# Patient Record
Sex: Female | Born: 1950
Health system: Southern US, Community
[De-identification: ages and names within clinical notes are randomized; demographics above are authoritative.]

## PROBLEM LIST (undated history)

## (undated) DIAGNOSIS — E785 Hyperlipidemia, unspecified: Secondary | ICD-10-CM

## (undated) DIAGNOSIS — M519 Unspecified thoracic, thoracolumbar and lumbosacral intervertebral disc disorder: Secondary | ICD-10-CM

## (undated) HISTORY — DX: Hyperlipidemia, unspecified: E78.5

## (undated) HISTORY — PX: CYSTECTOMY: SUR359

## (undated) HISTORY — DX: Unspecified thoracic, thoracolumbar and lumbosacral intervertebral disc disorder: M51.9

## (undated) HISTORY — PX: OTHER SURGICAL HISTORY: SHX169

---

## 1997-09-20 ENCOUNTER — Emergency Department (HOSPITAL_COMMUNITY): Admission: EM | Admit: 1997-09-20 | Discharge: 1997-09-20 | Payer: Self-pay | Admitting: Emergency Medicine

## 1997-09-23 ENCOUNTER — Emergency Department (HOSPITAL_COMMUNITY): Admission: EM | Admit: 1997-09-23 | Discharge: 1997-09-23 | Payer: Self-pay | Admitting: Emergency Medicine

## 1997-09-27 ENCOUNTER — Encounter (HOSPITAL_COMMUNITY): Admission: RE | Admit: 1997-09-27 | Discharge: 1997-12-26 | Payer: Self-pay | Admitting: Emergency Medicine

## 1997-10-29 ENCOUNTER — Emergency Department (HOSPITAL_COMMUNITY): Admission: EM | Admit: 1997-10-29 | Discharge: 1997-10-29 | Payer: Self-pay | Admitting: Emergency Medicine

## 1998-04-21 DIAGNOSIS — M519 Unspecified thoracic, thoracolumbar and lumbosacral intervertebral disc disorder: Secondary | ICD-10-CM

## 1998-04-21 HISTORY — DX: Unspecified thoracic, thoracolumbar and lumbosacral intervertebral disc disorder: M51.9

## 1998-08-08 ENCOUNTER — Other Ambulatory Visit: Admission: RE | Admit: 1998-08-08 | Discharge: 1998-08-08 | Payer: Self-pay | Admitting: Obstetrics and Gynecology

## 1999-09-23 ENCOUNTER — Other Ambulatory Visit: Admission: RE | Admit: 1999-09-23 | Discharge: 1999-09-23 | Payer: Self-pay | Admitting: Obstetrics and Gynecology

## 2002-02-28 ENCOUNTER — Other Ambulatory Visit: Admission: RE | Admit: 2002-02-28 | Discharge: 2002-02-28 | Payer: Self-pay | Admitting: Internal Medicine

## 2002-04-21 HISTORY — PX: OTHER SURGICAL HISTORY: SHX169

## 2003-06-06 ENCOUNTER — Other Ambulatory Visit: Admission: RE | Admit: 2003-06-06 | Discharge: 2003-06-06 | Payer: Self-pay | Admitting: Internal Medicine

## 2004-07-16 ENCOUNTER — Ambulatory Visit: Payer: Self-pay | Admitting: Family Medicine

## 2004-07-29 ENCOUNTER — Ambulatory Visit: Payer: Self-pay | Admitting: Family Medicine

## 2004-07-30 ENCOUNTER — Other Ambulatory Visit: Admission: RE | Admit: 2004-07-30 | Discharge: 2004-07-30 | Payer: Self-pay | Admitting: Family Medicine

## 2004-07-30 ENCOUNTER — Ambulatory Visit: Payer: Self-pay | Admitting: Family Medicine

## 2004-08-06 ENCOUNTER — Ambulatory Visit: Payer: Self-pay | Admitting: Family Medicine

## 2004-09-13 ENCOUNTER — Ambulatory Visit: Payer: Self-pay | Admitting: Family Medicine

## 2005-10-16 ENCOUNTER — Ambulatory Visit: Payer: Self-pay | Admitting: Family Medicine

## 2005-10-16 ENCOUNTER — Other Ambulatory Visit: Admission: RE | Admit: 2005-10-16 | Discharge: 2005-10-16 | Payer: Self-pay | Admitting: Family Medicine

## 2005-10-20 ENCOUNTER — Ambulatory Visit: Payer: Self-pay | Admitting: Family Medicine

## 2005-10-20 LAB — CONVERTED CEMR LAB: Blood Glucose, Fasting: 106 mg/dL

## 2005-11-07 ENCOUNTER — Ambulatory Visit: Payer: Self-pay | Admitting: Family Medicine

## 2006-12-11 ENCOUNTER — Encounter (INDEPENDENT_AMBULATORY_CARE_PROVIDER_SITE_OTHER): Payer: Self-pay | Admitting: Internal Medicine

## 2006-12-16 ENCOUNTER — Encounter (INDEPENDENT_AMBULATORY_CARE_PROVIDER_SITE_OTHER): Payer: Self-pay | Admitting: Internal Medicine

## 2006-12-16 ENCOUNTER — Ambulatory Visit (HOSPITAL_BASED_OUTPATIENT_CLINIC_OR_DEPARTMENT_OTHER): Admission: RE | Admit: 2006-12-16 | Discharge: 2006-12-16 | Payer: Self-pay | Admitting: Orthopedic Surgery

## 2006-12-16 ENCOUNTER — Encounter (INDEPENDENT_AMBULATORY_CARE_PROVIDER_SITE_OTHER): Payer: Self-pay | Admitting: Orthopedic Surgery

## 2006-12-16 HISTORY — PX: OTHER SURGICAL HISTORY: SHX169

## 2006-12-25 ENCOUNTER — Encounter: Payer: Self-pay | Admitting: Family Medicine

## 2006-12-29 ENCOUNTER — Encounter: Payer: Self-pay | Admitting: Family Medicine

## 2006-12-30 ENCOUNTER — Encounter: Payer: Self-pay | Admitting: Family Medicine

## 2006-12-30 DIAGNOSIS — E7800 Pure hypercholesterolemia, unspecified: Secondary | ICD-10-CM | POA: Insufficient documentation

## 2006-12-30 DIAGNOSIS — N6009 Solitary cyst of unspecified breast: Secondary | ICD-10-CM | POA: Insufficient documentation

## 2006-12-30 DIAGNOSIS — R7309 Other abnormal glucose: Secondary | ICD-10-CM | POA: Insufficient documentation

## 2006-12-31 ENCOUNTER — Encounter (INDEPENDENT_AMBULATORY_CARE_PROVIDER_SITE_OTHER): Payer: Self-pay | Admitting: Internal Medicine

## 2007-01-01 ENCOUNTER — Encounter (INDEPENDENT_AMBULATORY_CARE_PROVIDER_SITE_OTHER): Payer: Self-pay | Admitting: *Deleted

## 2007-01-20 ENCOUNTER — Encounter: Payer: Self-pay | Admitting: Family Medicine

## 2007-01-21 ENCOUNTER — Ambulatory Visit: Payer: Self-pay | Admitting: Family Medicine

## 2007-01-24 LAB — CONVERTED CEMR LAB
BUN: 12 mg/dL (ref 6–23)
CO2: 30 meq/L (ref 19–32)
Cholesterol: 191 mg/dL (ref 0–200)
GFR calc Af Amer: 84 mL/min
Glucose, Bld: 87 mg/dL (ref 70–99)
Potassium: 4.5 meq/L (ref 3.5–5.1)
Sodium: 140 meq/L (ref 135–145)
Total CHOL/HDL Ratio: 3.7
Triglycerides: 67 mg/dL (ref 0–149)

## 2007-01-26 ENCOUNTER — Other Ambulatory Visit: Admission: RE | Admit: 2007-01-26 | Discharge: 2007-01-26 | Payer: Self-pay | Admitting: Family Medicine

## 2007-01-26 ENCOUNTER — Encounter: Payer: Self-pay | Admitting: Family Medicine

## 2007-01-26 ENCOUNTER — Ambulatory Visit: Payer: Self-pay | Admitting: Family Medicine

## 2007-01-26 LAB — CONVERTED CEMR LAB: Pap Smear: NORMAL

## 2007-01-27 ENCOUNTER — Encounter: Payer: Self-pay | Admitting: Family Medicine

## 2007-02-03 ENCOUNTER — Encounter (INDEPENDENT_AMBULATORY_CARE_PROVIDER_SITE_OTHER): Payer: Self-pay | Admitting: *Deleted

## 2007-03-09 ENCOUNTER — Ambulatory Visit: Payer: Self-pay | Admitting: Family Medicine

## 2007-03-10 ENCOUNTER — Encounter (INDEPENDENT_AMBULATORY_CARE_PROVIDER_SITE_OTHER): Payer: Self-pay | Admitting: *Deleted

## 2008-10-02 ENCOUNTER — Ambulatory Visit: Payer: Self-pay | Admitting: Family Medicine

## 2008-10-03 LAB — CONVERTED CEMR LAB
ALT: 18 units/L (ref 0–35)
AST: 19 units/L (ref 0–37)
Bilirubin, Direct: 0.1 mg/dL (ref 0.0–0.3)
Chloride: 101 meq/L (ref 96–112)
LDL Cholesterol: 116 mg/dL — ABNORMAL HIGH (ref 0–99)
Microalb Creat Ratio: 3.6 mg/g (ref 0.0–30.0)
Microalb, Ur: 0.4 mg/dL (ref 0.0–1.9)
Potassium: 4.7 meq/L (ref 3.5–5.1)
Total Bilirubin: 0.7 mg/dL (ref 0.3–1.2)
Total CHOL/HDL Ratio: 3

## 2008-10-09 ENCOUNTER — Other Ambulatory Visit: Admission: RE | Admit: 2008-10-09 | Discharge: 2008-10-09 | Payer: Self-pay | Admitting: Family Medicine

## 2008-10-09 ENCOUNTER — Ambulatory Visit: Payer: Self-pay | Admitting: Family Medicine

## 2008-10-09 ENCOUNTER — Encounter: Payer: Self-pay | Admitting: Family Medicine

## 2008-10-09 LAB — HM PAP SMEAR

## 2008-10-13 ENCOUNTER — Encounter (INDEPENDENT_AMBULATORY_CARE_PROVIDER_SITE_OTHER): Payer: Self-pay | Admitting: *Deleted

## 2008-10-25 ENCOUNTER — Encounter: Payer: Self-pay | Admitting: Family Medicine

## 2009-11-21 ENCOUNTER — Encounter (INDEPENDENT_AMBULATORY_CARE_PROVIDER_SITE_OTHER): Payer: Self-pay | Admitting: *Deleted

## 2009-12-29 ENCOUNTER — Emergency Department (HOSPITAL_BASED_OUTPATIENT_CLINIC_OR_DEPARTMENT_OTHER): Admission: EM | Admit: 2009-12-29 | Discharge: 2009-12-30 | Payer: Self-pay | Admitting: Emergency Medicine

## 2009-12-29 ENCOUNTER — Ambulatory Visit: Payer: Self-pay | Admitting: Diagnostic Radiology

## 2010-01-09 ENCOUNTER — Ambulatory Visit: Payer: Self-pay | Admitting: Family Medicine

## 2010-01-14 ENCOUNTER — Telehealth (INDEPENDENT_AMBULATORY_CARE_PROVIDER_SITE_OTHER): Payer: Self-pay | Admitting: *Deleted

## 2010-01-15 ENCOUNTER — Ambulatory Visit: Payer: Self-pay | Admitting: Family Medicine

## 2010-01-16 LAB — CONVERTED CEMR LAB
AST: 17 units/L (ref 0–37)
Albumin: 4.4 g/dL (ref 3.5–5.2)
Alkaline Phosphatase: 66 units/L (ref 39–117)
Bilirubin, Direct: 0.1 mg/dL (ref 0.0–0.3)
CO2: 31 meq/L (ref 19–32)
Direct LDL: 132.5 mg/dL
GFR calc non Af Amer: 78.62 mL/min (ref >60)
Glucose, Bld: 92 mg/dL (ref 70–99)
Potassium: 4.6 meq/L (ref 3.5–5.1)
Sodium: 142 meq/L (ref 135–145)
VLDL: 22.4 mg/dL (ref 0.0–40.0)

## 2010-01-17 ENCOUNTER — Ambulatory Visit: Payer: Self-pay | Admitting: Family Medicine

## 2010-01-31 ENCOUNTER — Ambulatory Visit: Payer: Self-pay | Admitting: Family Medicine

## 2010-02-01 ENCOUNTER — Encounter (INDEPENDENT_AMBULATORY_CARE_PROVIDER_SITE_OTHER): Payer: Self-pay | Admitting: *Deleted

## 2010-02-01 ENCOUNTER — Encounter: Payer: Self-pay | Admitting: Family Medicine

## 2010-02-01 LAB — CONVERTED CEMR LAB: Fecal Occult Bld: NEGATIVE

## 2010-05-21 NOTE — Letter (Signed)
Summary: Results Follow up Letter  [place] at [facility]  [address] [place], [place] [address]   Phone: [phone]  Fax: [phone]    02/01/2010 MRN: [id number]    Cheryl Melendez [address] [place], [place]  [address]    Dear [name],  The following are the results of your recent test(s):  Test         Result    Pap Smear:        Normal _____  Not Normal _____ Comments: ______________________________________________________ Cholesterol: LDL(Bad cholesterol):         Your goal is less than:         HDL (Good cholesterol):       Your goal is more than: Comments:  ______________________________________________________ Mammogram:        Normal _____  Not Normal _____ Comments:  ___________________________________________________________________ Hemoccult:        Normal _X____  Not normal _______ Comments:    _____________________________________________________________________ Other Tests:    We routinely do not discuss normal results over the telephone.  If you desire a copy of the results, or you have any questions about this information we can discuss them at your next office visit.   Sincerely,    Roselyn Bering  GD/ri

## 2010-05-21 NOTE — Letter (Signed)
Summary: [facility]: Immunoassay Fecal Occult Blood (iFOB) Order Form  [place] at [facility]  [address] [place], [place] [address]   Phone: [phone]  Fax: [phone]      [facility]: Immunoassay Fecal Occult Blood (iFOB) Order Form   January 17, 2010 MRN: [id number]   Cheryl Melendez 02-02-1951   Physicican Name:______duncan___________________  Diagnosis Code:_______v76.49___________________      Crawford Givens MD

## 2010-05-21 NOTE — Assessment & Plan Note (Signed)
Summary: CPX/JRR   Vital Signs:  Patient profile:   59 year old female Height:      66.25 inches Weight:      165 pounds BMI:     26.53 Temp:     98.6 degrees F oral Pulse rate:   96 / minute Pulse rhythm:   regular BP sitting:   108 / 70  (left arm) Cuff size:   large  Vitals Entered By: Delilah Shan CMA Duncan Dull) (January 17, 2010 2:31 PM) CC: CPX -  ? Pap (menopausal)   History of Present Illness: Here for CPE.  No c/o.  L 5th finger healing well. See plan.   Allergies: 1)  ! * Bee Stings 2)  Sulfa  Past History:  Family History: Last updated: 01/17/2010 Father: A DM, CABGx 4 Mother: dec 85  HTN, alzheimer's Brother A  HTN glaucoma  Smoke ETOH Brother  A   obesity    knee arthritis Smoke  Social History: Last updated: 01/17/2010 Marital Status: Married 1975 lives with husband. 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 Father: A DM, CABGx 4 Mother: dec 85  HTN, alzheimer's Brother A  HTN glaucoma  Smoke ETOH Brother  A   obesity    knee arthritis Smoke  Social History: Reviewed history from 10/09/2008 and no changes required. Marital Status: Married 1975 lives with husband. 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  Otherwise negative.   labs reviewed with patient.   Physical Exam  General:  GEN: nad, alert and oriented HEENT: mucous membranes moist NECK: supple w/o LA CV: rrr.  no murmur PULM: ctab, no inc wob ABD: soft, +bs EXT: no  edema SKIN: no acute rash, lac on finger healing w/o erythema.  finger ROM intact    Impression & Recommendations:  Problem # 1:  Preventive Health Care (ICD-V70.0) IFOB sent home iwht patient.  Flu shot at work.  Mammogram deferred to next year.  q2 year screening for low risk patient is acceptable and patient prefers.  no h/o breast CA/mass/discharge/new finding per patient.  H/o many (>20 per pt) negative paps with no vaginal/GU symptoms.  Will defer pap until 2013. d/w patient re: options re: colon CA screening, including risk and benefits of IFOB vs. colonoscopy.  Pt is aware of all options and opts for IFOB.  d/w patient RW:ERXV, increase exercise and recheck in 1 year.  healthy diet d/w patient.   Complete Medication List: 1)  Cod Liver Oil  .... Take one by mouth once a day 2)  Fish Oil 1000 Mg Caps (Omega-3 fatty acids) .... Take one by mouth once a day 3)  Multivitamins Tabs (Multiple vitamin) .... One daily 4)  Epipen 0.3 Mg/0.63ml (1:1000) Devi (Epinephrine hcl (anaphylaxis)) .... As dir  Patient Instructions: 1)  Get your flu shot at work and drop off the stool cards.  We'll let you know about the results. 2)  Start back with yoga after your hand finishes healing.  3)  I would  get a mammogram in 2012 and a pap smear in 2013.  4)  Take care.  Glad to see you today.  Prescriptions: EPIPEN 0.3 MG/0.3ML (1:1000) DEVI (EPINEPHRINE HCL (ANAPHYLAXIS)) as dir  #1 x 1   Entered and Authorized by:   Crawford Givens MD   Signed by:   Crawford Givens MD on 01/17/2010   Method used:   [facility]        [facility]  [address] (retail)       [address]       [place], [place]  [address]       Ph: [phone] or [phone]       Fax: [phone]   RxID:   [id number]   Current Allergies (reviewed today): ! * BEE STINGS SULFA

## 2010-05-21 NOTE — Letter (Signed)
Summary: Results Follow up Letter  [place] at [facility]  [address] [place], [place] [address]   Phone: [phone]  Fax: [phone]    02/01/2010 MRN: [id number]  [name] [address] [place], [place]  [address]  Dear Ms. Cheryl Melendez,  The following are the results of your recent test(s):  Test         Result    Pap Smear:        Normal _____  Not Normal _____ Comments: ______________________________________________________ Cholesterol: LDL(Bad cholesterol):         Your goal is less than:         HDL (Good cholesterol):       Your goal is more than: Comments:  ______________________________________________________ Mammogram:        Normal _____  Not Normal _____ Comments:  ___________________________________________________________________ Hemoccult:        Normal __X___  Not normal _______ Comments:  Repeat in 1 year  _____________________________________________________________________ Other Tests:    We routinely do not discuss normal results over the telephone.  If you desire a copy of the results, or you have any questions about this information we can discuss them at your next office visit.   Sincerely,     Janee Morn, CMA for  Dr. Raechel Ache

## 2010-05-21 NOTE — Letter (Signed)
Summary: Nadara Eaton letter  [place] at [facility]  [address] [place], [place] [address]   Phone: [phone]  Fax: [phone]       11/21/2009 MRN: [id number]  JAILEY BOOTON [address] [place], [place]  [address]  Dear Ms. [facility] [place] [place] announce the retirement of Arta Silence, M.D., from full-time practice at the [facility] office effective October 18, 2009 and his plans of returning part-time.  It is important to Dr. Hetty Ely and to our practice that you understand that [facility] has seven physicians in our office for your health care needs.  We will continue to offer the same exceptional care that you have today.    Dr. Hetty Ely has spoken to many of you about his plans for retirement and returning part-time in the fall.   We will continue to work with you through the transition to schedule appointments for you in the office and meet the high standards that [place] is committed to.   Again, it is with great pleasure that we share the news that Dr. Hetty Ely will return to [facility] in October of 2011 with a reduced schedule.    If you have any questions, or would like to request an appointment with one of our physicians, please call us at [phone] and press the option for Scheduling an appointment.  We take pleasure in providing you with excellent patient care and look forward to seeing you at your next office visit.  Our [facility] Physicians are:  Tillman Abide, M.D. Laurita Quint, M.D. Roxy Manns, M.D. Kerby Nora, M.D. Hannah Beat, M.D. Ruthe Mannan, M.D. We proudly welcomed Raechel Ache, M.D. and Eustaquio Boyden, M.D. to the practice in July/August 2011.  Sincerely,  [place] [facility]

## 2010-05-21 NOTE — Progress Notes (Signed)
----   Converted from flag ---- ---- 01/14/2010 8:27 AM, Crawford Givens MD wrote: cmet/lipid 272.0  ---- 01/14/2010 7:52 AM, Liane Comber CMA (AAMA) wrote: Lab orders please! Good Morning! This pt is scheduled for cpx labs tomorrow, which labs to draw and dx codes to use? Thanks Tasha ------------------------------

## 2010-05-21 NOTE — Assessment & Plan Note (Signed)
Summary: F/U [place] 12/29/09/[facility]   Vital Signs:  Patient profile:   60 year old female Height:      66.25 inches Weight:      163.50 pounds BMI:     26.29 Temp:     98.3 degrees F oral Pulse rate:   72 / minute Pulse rhythm:   regular BP sitting:   120 / 72  (left arm)  Vitals Entered By: Melody Comas (January 09, 2010 3:35 PM)  History of Present Illness: Seen at ER 12/30/09.  9 suture to L 5th digit.  here for removal.  feeling well.  Some mild decrease in sensation on 5th digit.  Tdap updated at ER.   Allergies: 1)  ! * Bee Stings 2)  Sulfa  Review of Systems       See HPI.  Otherwise negative.    Physical Exam  General:  L 5th finger with 9 intact sutures and good tissue approximation.  No erythema.  9 sutures removed successfully.  Still with good tissue approximation.  There was some graulation tissue that was removed to expose the 9th suture.  Area covered with bandaid and bacitracin.  Tolerated well. No decrease in ext but with some mild decrease in ROM for flex at PIP and DIP.  Sensation intact on finger distally but slightly decreased from 4th digit.  Normal cap refill.    Impression & Recommendations:  Problem # 1:  ENCOUNTER FOR REMOVAL OF SUTURES (ICD-V58.32) Removed.  d/w patient re:ROM and wound care.  Gradual return to activity.  Fu as needed. She understood.  Orders: Suture Removal by Non-Operative MD [phone])  Complete Medication List: 1)  Cod Liver Oil  .... Take one by mouth once a day 2)  Fish Oil 1000 Mg Caps (Omega-3 fatty acids) .... Take one by mouth once a day 3)  Multivitamins Tabs (Multiple vitamin) .... One daily 4)  Epipen 0.3 Mg/0.11ml (1:1000) Devi (Epinephrine hcl (anaphylaxis)) .... As dir  Patient Instructions: 1)  Work on your range of motion with a tennis ball.  Use a bandaid and bacitracin in the meantime.  Let us know if you have redness or drainage from the lesion.  Take care.  Glad to see you.   Current Allergies  (reviewed today): ! * BEE STINGS SULFA

## 2010-07-31 ENCOUNTER — Encounter: Payer: Self-pay | Admitting: Family Medicine

## 2010-08-01 ENCOUNTER — Encounter: Payer: Self-pay | Admitting: Family Medicine

## 2010-08-01 ENCOUNTER — Ambulatory Visit (INDEPENDENT_AMBULATORY_CARE_PROVIDER_SITE_OTHER): Payer: BC Managed Care – PPO | Admitting: Family Medicine

## 2010-08-01 VITALS — BP 108/64 | HR 76 | Temp 98.3°F | Ht 66.0 in | Wt 164.8 lb

## 2010-08-01 DIAGNOSIS — J069 Acute upper respiratory infection, unspecified: Secondary | ICD-10-CM

## 2010-08-01 DIAGNOSIS — J029 Acute pharyngitis, unspecified: Secondary | ICD-10-CM

## 2010-08-01 NOTE — Progress Notes (Signed)
  Subjective:    Patient ID: Cheryl Melendez, female    DOB: [birth year], 60 y.o.   MRN: [id number]  HPI Pt here for ST for three days, some chills yesterday, no fever, was tired yesterday. She has had headache around the eyes, mucous, has been taking sudafed, tyl and benadryl at night. No ear pain, no fever. She is achy.     Review of Systems Noncontrib except as above.     Objective:   Physical Exam  Constitutional: She appears well-developed and well-nourished. No distress.  HENT:  Head: Normocephalic and atraumatic.  Right Ear: External ear normal.  Left Ear: External ear normal.  Nose: Nose normal.  Mouth/Throat: Oropharynx is clear and moist. No oropharyngeal exudate.  Eyes: Conjunctivae and EOM are normal. Pupils are equal, round, and reactive to light.  Neck: Normal range of motion. Neck supple. No thyromegaly present.  Cardiovascular: Normal rate, regular rhythm and normal heart sounds.   Pulmonary/Chest: Effort normal and breath sounds normal. She has no wheezes. She has no rales.  Lymphadenopathy:    She has no cervical adenopathy.  Skin: She is not diaphoretic.          Assessment & Plan:  Presumed viral URI Rapid strep done. Neg. See instructions.

## 2010-08-01 NOTE — Patient Instructions (Signed)
Take Guaifenesin (400mg ), take 11/2 tabs by mouth AM and NOON. Get GUAIFENESIN by  [facility] or [facility] and getting MUCOUS RELIEF EXPECTORANT/CONGESTION. DO NOT GET MUCINEX (Timed Release Guaifenesin) Gargle with 30ccs of warm salt water every half hour for 2 days as able. Tylenol 1-2 tabs po q4h prn Keep lozenge in mouth all day long while awake to decrease reactive cough.

## 2010-09-03 NOTE — Op Note (Signed)
NAME:  Cheryl Melendez, Cheryl Melendez NO.:  [id number]   MEDICAL RECORD NO.:  [id number]          PATIENT TYPE:  AMB   LOCATION:  DSC                          FACILITY:  [facility]   PHYSICIAN:  Cindee Salt, M.D.       DATE OF BIRTH:  [birth year]   DATE OF PROCEDURE:  12/16/2006  DATE OF DISCHARGE:                               OPERATIVE REPORT   PREOPERATIVE DIAGNOSIS:  Mucoid cyst, left thumb.   POSTOPERATIVE DIAGNOSIS:  Mucoid cyst, left thumb.   OPERATION:  Excision of cyst, excision of loose body, debridement of  interphalangeal joint, left thumb.   SURGEON:  Cindee Salt, M.D.   ASSISTANT:  Alfredo Bach, R.N.   ANESTHESIA:  Forearm-based IV regional.   HISTORY:  The patient is a 60 year old female with a history of a mass  over the dorsal aspect of the PIP joint of her thumb and degenerative  changes on x-ray.  This does transilluminate.  She is desirous of having  this excised with debridement of the joint.  She is aware of risks and  complications including infection, recurrence, injury to arteries,  nerves or tendons, incomplete relief of symptoms, dystrophy, stiffness  of the joint.  In the preoperative area, the patient is seen, questions  encouraged and answered, the extremity marked by both the patient and  surgeon.   PROCEDURE:  The patient was brought to the operating room, where a  forearm-based IV regional anesthetic was carried out without difficulty.  She was prepped using DuraPrep, supine position, left arm free.  A  curvilinear incision was made over the interphalangeal joint and carried  down through subcutaneous tissue.  The cyst was immediately encountered  with typical cystic fluid.  With blunt and sharp dissection, this was  dissected free.  A large loose body was present; this was removed and  the joint opened, a debridement and synovectomy performed to the joint.  Moderate arthritic changes were present.  The wound was irrigated.  Specimen was sent to  Pathology.  The skin was then closed with  interrupted Vicryl Rapide sutures of 5-0 in nature.  A sterile  compressive dressing and splint to the thumb were applied.  The patient  tolerated the procedure well and was taken to the recovery room for  observation in satisfactory condition.   She is discharged home to return to the [facility] in 1  week on Vicodin.           ______________________________  Cindee Salt, M.D.     GK/MEDQ  D:  12/16/2006  T:  12/17/2006  Job:  [id number]   cc:   Arta Silence, MD

## 2011-01-31 LAB — POCT HEMOGLOBIN-HEMACUE
Hemoglobin: 15.3 — ABNORMAL HIGH
Operator id: 208731

## 2011-07-29 ENCOUNTER — Telehealth: Payer: Self-pay | Admitting: Family Medicine

## 2011-07-29 NOTE — Telephone Encounter (Signed)
Noted. Thanks.

## 2011-07-29 NOTE — Telephone Encounter (Signed)
Caller: Cheryl Melendez/Patient; PCP: Laurita Quint (retired); CB#: [phone]; ; ; Call regarding Cut On Shin From Garden Implement, May Need Stitches; 2"x 1/2" avulsion type laceration to her shin from a gardening tool at 1615 today. Pt has washed and dressed wound. Bleeding controlled. Tetanus UTD. Advised see in 4 hrs per lac protocol. RN spoke to [place] in the ofc who advises UC due to the lateness of the day. Pt is advised.

## 2011-07-30 ENCOUNTER — Telehealth: Payer: Self-pay | Admitting: Family Medicine

## 2011-07-30 NOTE — Telephone Encounter (Signed)
Noted.  Duplicate from yesterday's note.

## 2011-07-30 NOTE — Telephone Encounter (Signed)
Triage Record Num: [id number] Operator: Freddie Breech Patient Name: Cheryl Melendez Call Date & Time: 07/29/2011 4:46:38PM Patient Phone: [phone] PCP: Eustaquio Boyden Patient Gender: Female PCP Fax : [phone] Patient DOB: 1950/09/15 Practice Name: [facility] Reason for Call: Caller: [name]/Patient; PCP: Laurita Quint (retired); CB#: [phone]; ; ; Call regarding Cut On Shin From Garden Implement, May Need Stitches; 2"x 1/2" avulsion type laceration to her shin from a gardening tool at 1615 today. Pt has washed and dressed wound, drsg appled. Bleeding controlled. Tetanus UTD. Advised see in 4 hrs per lac protocol. RN spoke to [place] in the ofc who advises UC due to the lateness of the day. Pt is advised. Protocol(s) Used: Abrasions, Lacerations, Puncture Wounds Recommended Outcome per Protocol: See Provider within 4 hours Reason for Outcome: Any full thickness puncture wound (passes through the skin layers of epidermis and dermis and penetrates into the underlying subcutaneous tissue) Care Advice: ~ Another adult should drive. ~ Support injured part in position of comfort to reduce pain and prevent further damage; avoid unnecessary movement. Control Bleeding: - Cover wound with a clean cloth and apply firm pressure to control bleeding. - If bleeding continues through dressing, add more material. - DO NOT remove old dressing. - Continue to apply direct pressure. ~ 07/29/2011 4:51:33PM Page 1 of 1 [id number]

## 2011-09-08 ENCOUNTER — Encounter: Payer: Self-pay | Admitting: Family Medicine

## 2011-09-08 ENCOUNTER — Ambulatory Visit (INDEPENDENT_AMBULATORY_CARE_PROVIDER_SITE_OTHER): Payer: BC Managed Care – PPO | Admitting: Family Medicine

## 2011-09-08 VITALS — BP 112/70 | HR 60 | Temp 98.0°F | Wt 169.0 lb

## 2011-09-08 DIAGNOSIS — L03114 Cellulitis of left upper limb: Secondary | ICD-10-CM | POA: Insufficient documentation

## 2011-09-08 DIAGNOSIS — IMO0002 Reserved for concepts with insufficient information to code with codable children: Secondary | ICD-10-CM

## 2011-09-08 MED ORDER — CEPHALEXIN 500 MG PO CAPS: 500.0000 mg | ORAL_CAPSULE | Freq: Four times a day (QID) | ORAL | Status: AC

## 2011-09-08 NOTE — Patient Instructions (Addendum)
Call to schedule mammogram.  [phone] Return at your convenience to schedule physical. We will treat you as cellulitis for next 9 days.  Four times a day. If spreading, or fevers >101, nausea/vomiting, HA, neck stiffness, or confusion or joint pains please let us know.

## 2011-09-08 NOTE — Progress Notes (Signed)
  Subjective:    Patient ID: Cheryl Melendez, female    DOB: 03-22-1951, 61 y.o.   MRN: [id number]  HPI CC: swelling arm  2d h/o swelling under left arm just distal to axilla. Woke up with this.  Never itchy.  + painful, swollen, red, warm.  Had leftover keflex so started taking qid, has taken 4-5 pills.    Cat at home.  No cat bites or scratches.    Doesn't think anything bit her really.    No fevers/chills, abd pain, n/v, other rashes.  No swollen glands that she's noticed.  No new medicines, lotions, detergents, soaps or shampoos.  Never had similar rash.  R dorsal hand with bruise.  States tends to bruise easily.  Mammogram - last done 2010.  Review of Systems Per HPI    Objective:   Physical Exam  Nursing note and vitals reviewed. Constitutional: She appears well-developed and well-nourished. No distress.  HENT:  Mouth/Throat: Oropharynx is clear and moist. No oropharyngeal exudate.  Neck: Normal range of motion. Neck supple.  Lymphadenopathy:    She has no cervical adenopathy.    She has no axillary adenopathy.       Right axillary: No lateral adenopathy present.       Left axillary: No lateral adenopathy present.      Right: No supraclavicular adenopathy present.       Left: No supraclavicular adenopathy present.  Skin:          Left underarm distal to axilla with warm erythema present, not pruritic, somewhat tender.  Measured 4.5inches longitudinally       Assessment & Plan:

## 2011-09-08 NOTE — Assessment & Plan Note (Signed)
Recent chigger bites, ?if inciting event. Treat with finishing keflex course x 10 days. Update [place] if not improved after this. Doubt tick-borne as rash warm, tender, and no recent tick bites found.

## 2011-12-03 ENCOUNTER — Other Ambulatory Visit: Payer: Self-pay | Admitting: Family Medicine

## 2011-12-03 ENCOUNTER — Encounter: Payer: Self-pay | Admitting: Family Medicine

## 2011-12-03 ENCOUNTER — Other Ambulatory Visit (INDEPENDENT_AMBULATORY_CARE_PROVIDER_SITE_OTHER): Payer: BC Managed Care – PPO

## 2011-12-03 DIAGNOSIS — E7800 Pure hypercholesterolemia, unspecified: Secondary | ICD-10-CM

## 2011-12-03 LAB — LDL CHOLESTEROL, DIRECT: Direct LDL: 126.1 mg/dL

## 2011-12-03 LAB — LIPID PANEL
Cholesterol: 201 mg/dL — ABNORMAL HIGH (ref 0–200)
HDL: 64.5 mg/dL (ref >39.00)
Triglycerides: 70 mg/dL (ref 0.0–149.0)
VLDL: 14 mg/dL (ref 0.0–40.0)

## 2011-12-03 LAB — COMPREHENSIVE METABOLIC PANEL
AST: 17 U/L (ref 0–37)
Alkaline Phosphatase: 62 U/L (ref 39–117)
BUN: 18 mg/dL (ref 6–23)
Creatinine, Ser: 0.8 mg/dL (ref 0.4–1.2)
Glucose, Bld: 89 mg/dL (ref 70–99)
Potassium: 4.4 mEq/L (ref 3.5–5.1)
Total Bilirubin: 0.3 mg/dL (ref 0.3–1.2)

## 2011-12-04 ENCOUNTER — Encounter: Payer: Self-pay | Admitting: Family Medicine

## 2011-12-05 ENCOUNTER — Encounter: Payer: Self-pay | Admitting: *Deleted

## 2011-12-10 ENCOUNTER — Other Ambulatory Visit (HOSPITAL_COMMUNITY)
Admission: RE | Admit: 2011-12-10 | Discharge: 2011-12-10 | Disposition: A | Discharge status: Home / Self Care | Payer: BC Managed Care – PPO | Source: Ambulatory Visit | Attending: Family Medicine | Admitting: Family Medicine

## 2011-12-10 ENCOUNTER — Ambulatory Visit (INDEPENDENT_AMBULATORY_CARE_PROVIDER_SITE_OTHER): Payer: BC Managed Care – PPO | Admitting: Family Medicine

## 2011-12-10 ENCOUNTER — Encounter: Payer: Self-pay | Admitting: Family Medicine

## 2011-12-10 VITALS — BP 120/80 | HR 80 | Temp 98.1°F | Ht 65.5 in | Wt 169.0 lb

## 2011-12-10 DIAGNOSIS — Z0000 Encounter for general adult medical examination without abnormal findings: Secondary | ICD-10-CM

## 2011-12-10 DIAGNOSIS — B070 Plantar wart: Secondary | ICD-10-CM

## 2011-12-10 DIAGNOSIS — E7800 Pure hypercholesterolemia, unspecified: Secondary | ICD-10-CM

## 2011-12-10 DIAGNOSIS — Z1151 Encounter for screening for human papillomavirus (HPV): Secondary | ICD-10-CM | POA: Insufficient documentation

## 2011-12-10 DIAGNOSIS — Z01419 Encounter for gynecological examination (general) (routine) without abnormal findings: Secondary | ICD-10-CM | POA: Insufficient documentation

## 2011-12-10 MED ORDER — EPINEPHRINE 0.3 MG/0.3ML IJ DEVI: 0.3000 mg | Freq: Once | INTRAMUSCULAR | Status: DC

## 2011-12-10 NOTE — Patient Instructions (Addendum)
Could space out aspirin if desired. Good to see you today, call us with questions. Return in 1 year or as needed. For plantar wart: Treat with salicylic plaster (duofilm) and pumice stone. File dry skin off with pumice stone then soak foot in epsom salt bath at bedtime, then apply duofilm (or salicylic plaster and duct tape), keep dry and on for ~48 hours.  remove and repeat. Balance pain with treatment. Let us know how you are doing in 2-3 wks.  If no better, we may send you to foot doctor.

## 2011-12-10 NOTE — Assessment & Plan Note (Addendum)
Preventative protocols reviewed and updated unless pt declined. Discussed healthy diet/lifestyle. Mammogram normal 11/2011. iFOB today. Pelvic/pap today framingham risk 1%, may d/c or space out aspirin.

## 2011-12-10 NOTE — Assessment & Plan Note (Signed)
Chronic, stable, diet controlled. Lab Results  Component Value Date   CHOL 201* 12/03/2011   HDL 64.50 12/03/2011   LDLCALC 116* 10/02/2008   LDLDIRECT 126.1 12/03/2011   TRIG 70.0 12/03/2011   CHOLHDL 3 12/03/2011  reviewed #s with pt.

## 2011-12-10 NOTE — Assessment & Plan Note (Signed)
Reviewed treatment of plantar warts.  See pt instructions. If not improving as expected, refer to podiatry.

## 2011-12-10 NOTE — Progress Notes (Signed)
Subjective:    Patient ID: Cheryl Melendez, female    DOB: 1950/05/14, 61 y.o.   MRN: [id number]  HPI CC: CPE  Plantar's wart on right sole - using tape, has tried duct tape, has used clorox.  Present longstanding, years.  Initially used salicylic acid but always has come back.  Preventative: Last CPE 12/2009 Pap smear - last was 3 years ago, always normal. Mammogram - Birads1 11/2011. Colon screening - would like stool kits.  Has never had colonoscopy. Td 2007 Seat belt use 100% sunscreen use discussed  Caffeine: 2-3 cups coffee/day Married 1975 and lives with husband Occupation: Comptroller at [employer] Activity: walks, hikes, gardening, wants to restart yoga Diet: good water, fruits/vegetables daily, red meat 3-4 x/wk, fish 1x/wk  Medications and allergies reviewed and updated in chart.  Past histories reviewed and updated if relevant as below. Patient Active Problem List  Diagnosis  . HYPERCHOLESTEROLEMIA  . BREAST CYST, BENIGN  . HYPERGLYCEMIA  . ABNORMAL BLOOD CHEMISTRY NEC  . Cellulitis of arm, left   Past Medical History  Diagnosis Date  . HLD (hyperlipidemia)    Past Surgical History  Procedure Date  . Cystectomy 1990's    L branchial cleft  . Cesarean section     x 1  . Vaginal birth after cesarean section     x 1  . Excision of mucoid cyst 12/16/2006    left thumb  . Mri back 2004    L4/5 ruptured disc with radiculopathy   History  Substance Use Topics  . Smoking status: Never Smoker   . Smokeless tobacco: Never Used  . Alcohol Use: Yes     occ glass of wine   Family History  Problem Relation Age of Onset  . Hypertension Mother   . Alzheimer's disease Mother   . Diabetes Father   . Coronary artery disease Father 40    CABG x 4  . Hypertension Brother   . Alcohol abuse Brother   . Glaucoma Brother   . Arthritis Brother   . Obesity Brother     knee  . Cancer Neg Hx    Allergies  Allergen Reactions  . Bee Venom Swelling  . Sulfonamide  Derivatives     REACTION: rash   Current Outpatient Prescriptions on File Prior to Visit  Medication Sig Dispense Refill  . aspirin 81 MG tablet Take 81 mg by mouth daily.        . COD LIVER OIL PO Take by mouth daily.        . Multiple Vitamin (MULTIVITAMIN) tablet Take 1 tablet by mouth daily.        . Omega-3 Fatty Acids (FISH OIL) 1000 MG CAPS Take by mouth daily.           Review of Systems  Constitutional: Negative for fever, chills, activity change, appetite change, fatigue and unexpected weight change.  HENT: Negative for hearing loss and neck pain.   Eyes: Negative for visual disturbance.  Respiratory: Negative for cough, chest tightness, shortness of breath and wheezing.   Cardiovascular: Negative for chest pain, palpitations and leg swelling.  Gastrointestinal: Negative for nausea, vomiting, abdominal pain, diarrhea, constipation, blood in stool and abdominal distention.  Genitourinary: Negative for hematuria and difficulty urinating.  Musculoskeletal: Negative for myalgias and arthralgias.  Skin: Negative for rash.  Neurological: Negative for dizziness, seizures, syncope and headaches.  Hematological: Bruises/bleeds easily (takes asprin).  Psychiatric/Behavioral: Negative for dysphoric mood. The patient is not nervous/anxious.  Objective:   Physical Exam  Nursing note and vitals reviewed. Constitutional: She is oriented to person, place, and time. She appears well-developed and well-nourished. No distress.  HENT:  Head: Normocephalic and atraumatic.  Right Ear: External ear normal.  Left Ear: External ear normal.  Nose: Nose normal.  Mouth/Throat: Oropharynx is clear and moist. No oropharyngeal exudate.  Eyes: Conjunctivae and EOM are normal. Pupils are equal, round, and reactive to light. No scleral icterus.  Neck: Normal range of motion. Neck supple.  Cardiovascular: Normal rate, regular rhythm, normal heart sounds and intact distal pulses.   No murmur  heard. Pulses:      Radial pulses are 2+ on the right side, and 2+ on the left side.  Pulmonary/Chest: Effort normal and breath sounds normal. No respiratory distress. She has no wheezes. She has no rales. Right breast exhibits no inverted nipple, no mass, no nipple discharge, no skin change and no tenderness. Left breast exhibits no inverted nipple, no mass, no nipple discharge, no skin change and no tenderness. Breasts are symmetrical.  Abdominal: Soft. Bowel sounds are normal. She exhibits no distension and no mass. There is no tenderness. There is no rebound and no guarding.  Genitourinary: Vagina normal and uterus normal. Pelvic exam was performed with patient supine. There is no rash, tenderness, lesion or injury on the right labia. There is no rash, tenderness, lesion or injury on the left labia. Cervix exhibits no motion tenderness, no discharge and no friability. Right adnexum displays no mass, no tenderness and no fullness. Left adnexum displays no mass, no tenderness and no fullness. No tenderness around the vagina. No signs of injury around the vagina. No vaginal discharge found.  Musculoskeletal: Normal range of motion. She exhibits no edema.       Ball of right foot with noninfected lesion composed of macerated skin extending past epidermis and with 2-3 cores present.  Soft skin rpesent  Lymphadenopathy:    She has no cervical adenopathy.    She has no axillary adenopathy.       Right axillary: No lateral adenopathy present.       Left axillary: No lateral adenopathy present.      Right: No supraclavicular adenopathy present.       Left: No supraclavicular adenopathy present.  Neurological: She is alert and oriented to person, place, and time.       CN grossly intact, station and gait intact  Skin: Skin is warm and dry. No rash noted.  Psychiatric: She has a normal mood and affect. Her behavior is normal. Judgment and thought content normal.       Assessment & Plan:

## 2011-12-18 ENCOUNTER — Encounter: Payer: Self-pay | Admitting: *Deleted

## 2014-05-22 HISTORY — PX: HAND SURGERY: SHX662

## 2014-06-13 ENCOUNTER — Encounter: Payer: Self-pay | Admitting: Family Medicine

## 2016-09-29 ENCOUNTER — Encounter: Payer: Self-pay | Admitting: Internal Medicine

## 2016-09-29 ENCOUNTER — Ambulatory Visit (INDEPENDENT_AMBULATORY_CARE_PROVIDER_SITE_OTHER): Payer: BC Managed Care – PPO | Admitting: Internal Medicine

## 2016-09-29 VITALS — BP 126/74 | HR 71 | Temp 98.1°F | Ht 65.0 in | Wt 171.5 lb

## 2016-09-29 DIAGNOSIS — E7800 Pure hypercholesterolemia, unspecified: Secondary | ICD-10-CM

## 2016-09-29 DIAGNOSIS — R296 Repeated falls: Secondary | ICD-10-CM | POA: Diagnosis not present

## 2016-09-29 NOTE — Progress Notes (Signed)
HPI  Pt presents to the clinic today to reestablish care. She is transferring care from Dr. Morton StallGuttierez.  HLD: She reports she does not have high cholesterol. She does take Fish Oil daily. She tries to consume a low fat diet.  She is concerned about frequent falls. She has fallen twice in the last 3 years. She has had injuries with both falls. She is not sure what is causing her to fall, she reports she just falls straight down. She denies syncope, chest pain, shortness of breath. She does not stumble. She reports she has had a ruptured disc in 2008, but never had surgery. She feels like this caused some residual weakness on her right side but she is not positive. She denies family history of neuromuscular disorders.  Past Medical History:  Diagnosis Date  . HLD (hyperlipidemia)     Current Outpatient Prescriptions  Medication Sig Dispense Refill  . aspirin 81 MG tablet Take 81 mg by mouth daily. May space out    . COD LIVER OIL PO Take by mouth daily.      Marland Kitchen. EPINEPHrine (EPIPEN) 0.3 mg/0.3 mL DEVI Inject 0.3 mLs (0.3 mg total) into the muscle once. As directed 1 Device 1  . Multiple Vitamin (MULTIVITAMIN) tablet Take 1 tablet by mouth daily.      . Omega-3 Fatty Acids (FISH OIL) 1000 MG CAPS Take by mouth daily.       No current facility-administered medications for this visit.     Allergies  Allergen Reactions  . Bee Venom Swelling  . Sulfonamide Derivatives     REACTION: rash    Family History  Problem Relation Age of Onset  . Hypertension Mother   . Alzheimer's disease Mother   . Diabetes Father   . Coronary artery disease Father [id number]       CABG x 4  . Hypertension Brother   . Alcohol abuse Brother   . Glaucoma Brother   . Arthritis Brother   . Obesity Brother        knee  . Cancer Neg Hx     Social History   Social History  . Marital status: Married    Spouse name: N/A  . Number of children: 2  . Years of education: N/A   Occupational History  . Nursing  Teacher     [employer]   Social History Main Topics  . Smoking status: Never Smoker  . Smokeless tobacco: Never Used  . Alcohol use Yes     Comment: occ glass of wine  . Drug use: No  . Sexual activity: Not on file   Other Topics Concern  . Not on file   Social History Narrative   Caffeine: 2-3 cups coffee/day   Married 1975 and lives with husband   Occupation: Comptrollernursing teacher at [employer]   Activity: walks, hikes, gardening, wants to restart yoga   Diet: good water, fruits/vegetables daily, red meat 3-4 x/wk, fish 1x/wk    ROS:  Constitutional: Denies fever, malaise, fatigue, headache or abrupt weight changes.  HEENT: Denies eye pain, eye redness, ear pain, ringing in the ears, wax buildup, runny nose, nasal congestion, bloody nose, or sore throat. Respiratory: Denies difficulty breathing, shortness of breath, cough or sputum production.   Cardiovascular: Denies chest pain, chest tightness, palpitations or swelling in the hands or feet.  Gastrointestinal: Denies abdominal pain, bloating, constipation, diarrhea or blood in the stool.  GU: Denies frequency, urgency, pain with urination, blood in urine, odor or  discharge. Musculoskeletal: Pt reports frequent falls. Denies decrease in range of motion, difficulty with gait, muscle pain or joint pain and swelling.  Skin: Denies redness, rashes, lesions or ulcercations.  Neurological: Denies dizziness, difficulty with memory, difficulty with speech or problems with balance and coordination.  Psych: Denies anxiety, depression, SI/HI.  No other specific complaints in a complete review of systems (except as listed in HPI above).  PE:  BP 126/74   Pulse 71   Temp 98.1 F (36.7 C) (Oral)   Ht 5\' 5"  (1.651 m)   Wt 171 lb 8 oz (77.8 kg)   SpO2 98%   BMI 28.54 kg/m  Wt Readings from Last 3 Encounters:  09/29/16 171 lb 8 oz (77.8 kg)  12/10/11 169 lb (76.7 kg)  09/08/11 169 lb (76.7 kg)    General: Appears her stated  age, in NAD. Cardiovascular: Normal rate and rhythm.  Pulmonary/Chest: Normal effort and positive vesicular breath sounds. No respiratory distress. No wheezes, rales or ronchi noted.  Musculoskeletal: Normal flexion, extension, rotation and lateral bending of the spine. No pain with palpation of the lumbar spine. Strength 4/5 LLE, 5/5 RLE. She is able to walk on heels and tiptoes. Gait normal. Neurological: Alert and oriented. Sensation intact to BLE. Psychiatric: Mood and affect normal. Behavior is normal. Judgment and thought content normal.    BMET    Component Value Date/Time   NA 139 12/03/2011 0858   K 4.4 12/03/2011 0858   CL 104 12/03/2011 0858   CO2 29 12/03/2011 0858   GLUCOSE 89 12/03/2011 0858   BUN 18 12/03/2011 0858   CREATININE 0.8 12/03/2011 0858   CALCIUM 9.3 12/03/2011 0858   GFRNONAA 78.62 01/15/2010 0841   GFRAA 84 01/21/2007 0915    Lipid Panel     Component Value Date/Time   CHOL 201 (H) 12/03/2011 0858   TRIG 70.0 12/03/2011 0858   HDL 64.50 12/03/2011 0858   CHOLHDL 3 12/03/2011 0858   VLDL 14.0 12/03/2011 0858   LDLCALC 116 (H) 10/02/2008 0827    CBC    Component Value Date/Time   HGB 15.3 (H) 12/16/2006 0945    Hgb A1C No results found for: HGBA1C   Assessment and Plan:  Multiple Falls:  Will try PT, referral placed. See Shirlee Limerick on the way out to schedule  Make an appt for your annual exam Nicki Reaper, NP

## 2016-09-29 NOTE — Assessment & Plan Note (Signed)
Will check lipid profile at annual exam

## 2016-09-29 NOTE — Patient Instructions (Signed)

## 2016-10-20 ENCOUNTER — Ambulatory Visit (INDEPENDENT_AMBULATORY_CARE_PROVIDER_SITE_OTHER): Payer: BC Managed Care – PPO | Admitting: Family Medicine

## 2016-10-20 ENCOUNTER — Encounter: Payer: Self-pay | Admitting: Family Medicine

## 2016-10-20 VITALS — BP 138/70 | HR 87 | Temp 98.8°F | Ht 65.0 in | Wt 170.0 lb

## 2016-10-20 DIAGNOSIS — L03011 Cellulitis of right finger: Secondary | ICD-10-CM

## 2016-10-20 MED ORDER — AMOXICILLIN-POT CLAVULANATE 875-125 MG PO TABS: 1.0000 | ORAL_TABLET | Freq: Two times a day (BID) | ORAL | 0 refills | Status: AC

## 2016-10-20 NOTE — Progress Notes (Signed)
Dr. Karleen HampshireSpencer T. Makaley Storts, MD, CAQ Sports Medicine Primary Care and Sports Medicine [address] [place], [address] Phone: [phone] Fax: [phone]  10/20/2016  Patient: Cheryl Melendez, MRN: [id number], DOB: Oct 26, 1950, 66 y.o.  Primary Physician:  Eustaquio BoydenGutierrez, Javier, MD   No chief complaint on file.  Subjective:   Cheryl Melendez is a 66 y.o. very pleasant female patient who presents with the following:  Fell 5 stitches on R thumb, 6 weeks. Went to [facility] - brace. She also reportedly had a fracture. I don't have any of those records at all.  6 weeks out from injury.   09/12/2016  Told given a td shot.   Infected R finger. Since her injury she has had some decreased range of motion compared to her baseline. She is not having any fever.  Initially she reports that they gave her some Keflex.  Past Medical History, Surgical History, Social History, Family History, Problem List, Medications, and Allergies have been reviewed and updated if relevant.  Patient Active Problem List   Diagnosis Date Noted  . HYPERCHOLESTEROLEMIA 12/30/2006    Past Medical History:  Diagnosis Date  . HLD (hyperlipidemia)     Past Surgical History:  Procedure Laterality Date  . CESAREAN SECTION     x 1  . CYSTECTOMY  1990's   L branchial cleft  . excision of mucoid cyst  12/16/2006   left thumb  . HAND SURGERY Right 05/2014   R finger PIP joint dislocation s/p pinning [facility]  . MRI back  2004   L4/5 ruptured disc with radiculopathy  . VAGINAL BIRTH AFTER CESAREAN SECTION     x 1    Social History   Social History  . Marital status: Married    Spouse name: N/A  . Number of children: 2  . Years of education: N/A   Occupational History  . Nursing Teacher     [employer]   Social History Main Topics  . Smoking status: Never Smoker  . Smokeless tobacco: Never Used  . Alcohol use Yes     Comment: occ glass of wine  . Drug use: No  . Sexual activity:  Yes   Other Topics Concern  . Not on file   Social History Narrative   Caffeine: 2-3 cups coffee/day   Married 1975 and lives with husband   Occupation: Comptrollernursing teacher at [employer]   Activity: walks, hikes, gardening, wants to restart yoga   Diet: good water, fruits/vegetables daily, red meat 3-4 x/wk, fish 1x/wk    Family History  Problem Relation Age of Onset  . Hypertension Mother   . Alzheimer's disease Mother   . Diabetes Father   . Coronary artery disease Father [id number]       CABG x 4  . Hypertension Brother   . Alcohol abuse Brother   . Glaucoma Brother   . Arthritis Brother   . Obesity Brother        knee  . Cancer Neg Hx     Allergies  Allergen Reactions  . Bee Venom Swelling  . Sulfonamide Derivatives     REACTION: rash    Medication list reviewed and updated in full in [facility].  ROS: GEN: Acute illness details above GI: Tolerating PO intake GU: maintaining adequate hydration and urination Pulm: No SOB Interactive and getting along well at home.  Otherwise, ROS is as per the HPI.  Objective:   Blood pressure 138/70, pulse 87, temperature  98.8 F (37.1 C), temperature source Oral, height 5\' 5"  (1.651 m), weight 170 lb (77.1 kg).    GEN: WDWN, NAD, Non-toxic, Alert & Oriented x 3 HEENT: Atraumatic, Normocephalic.  Ears and Nose: No external deformity. EXTR: No clubbing/cyanosis/edema NEURO: Normal gait.  PSYCH: Normally interactive. Conversant. Not depressed or anxious appearing.  Calm demeanor.    Right hand there is some increased redness on the medial aspect of the right thumb around the IP joint. There is some tenderness to palpation. No tenderness to palpation at the nail which has some bruising. No significant tenderness in the flexor tendon sheath. She does have some decreased range of motion at the IP joint.   Laboratory and Imaging Data:  Assessment and Plan:   Cellulitis of thumb, right  Strict precautions reviewed. We will treat  her with Augmentin, and if she worsens over the next couple of days she knows to go the ER.  Follow-up: Return for follow-up Thursday with Dr. Patsy Lager, 15 min.  Future Appointments Date Time Provider Department Center  10/23/2016 3:15 PM Hannah Beat, MD [facility]  11/17/2016 1:30 PM Lorre Munroe, NP [facility]    Meds ordered this encounter  Medications  . amoxicillin-clavulanate (AUGMENTIN) 875-125 MG tablet    Sig: Take 1 tablet by mouth 2 (two) times daily.    Dispense:  20 tablet    Refill:  0   Medications Discontinued During This Encounter  Medication Reason  . aspirin 81 MG tablet Completed Course   Signed,  Kristof Nadeem T. Anshi Jalloh, MD   Allergies as of 10/20/2016      Reactions   Bee Venom Swelling   Sulfonamide Derivatives    REACTION: rash      Medication List       Accurate as of 10/20/16  1:31 PM. Always use your most recent med list.          amoxicillin-clavulanate 875-125 MG tablet Commonly known as:  AUGMENTIN Take 1 tablet by mouth 2 (two) times daily.   COD LIVER OIL PO Take by mouth daily.   EPINEPHrine 0.3 mg/0.3 mL Devi Commonly known as:  EPIPEN Inject 0.3 mLs (0.3 mg total) into the muscle once. As directed   Fish Oil 1000 MG Caps Take by mouth daily.   multivitamin tablet Take 1 tablet by mouth daily.

## 2016-10-23 ENCOUNTER — Ambulatory Visit (INDEPENDENT_AMBULATORY_CARE_PROVIDER_SITE_OTHER): Payer: BC Managed Care – PPO | Admitting: Family Medicine

## 2016-10-23 ENCOUNTER — Encounter: Payer: Self-pay | Admitting: Family Medicine

## 2016-10-23 VITALS — BP 110/60 | HR 80 | Temp 98.3°F | Ht 65.0 in | Wt 171.5 lb

## 2016-10-23 DIAGNOSIS — L03011 Cellulitis of right finger: Secondary | ICD-10-CM

## 2016-10-24 NOTE — Progress Notes (Signed)
Dr. Karleen HampshireSpencer T. Laquasha Groome, MD, CAQ Sports Medicine Primary Care and Sports Medicine [address] [place], [address] Phone: [phone] Fax: [phone]  10/23/2016  Patient: Cheryl ShipperJerrel D Melendez, MRN: [id number], DOB: [birth year], 66 y.o.  Primary Physician:  Lorre MunroeBaity, Regina W, NP   Chief Complaint  Patient presents with  . Follow-up    Right Thumb Cellulitis   Subjective:   Cheryl Melendez is a 66 y.o. very pleasant female patient who presents with the following:  Pleasant patient, follow-up for thumb cellulitis on the right side, follow-up after Monday visit.  I placed her on Augmentin, and her thumb is hurting less and is less red.  She also had a previous cut on this thumb and has a fracture from over 1 month ago.  Past Medical History, Surgical History, Social History, Family History, Problem List, Medications, and Allergies have been reviewed and updated if relevant.  Patient Active Problem List   Diagnosis Date Noted  . HYPERCHOLESTEROLEMIA 12/30/2006    Past Medical History:  Diagnosis Date  . HLD (hyperlipidemia)     Past Surgical History:  Procedure Laterality Date  . CESAREAN SECTION     x 1  . CYSTECTOMY  1990's   L branchial cleft  . excision of mucoid cyst  12/16/2006   left thumb  . HAND SURGERY Right 05/2014   R finger PIP joint dislocation s/p pinning [facility]([place])  . MRI back  2004   L4/5 ruptured disc with radiculopathy  . VAGINAL BIRTH AFTER CESAREAN SECTION     x 1    Social History   Social History  . Marital status: Married    Spouse name: N/A  . Number of children: 2  . Years of education: N/A   Occupational History  . Nursing Teacher     [employer]   Social History Main Topics  . Smoking status: Never Smoker  . Smokeless tobacco: Never Used  . Alcohol use Yes     Comment: occ glass of wine  . Drug use: No  . Sexual activity: Yes   Other Topics Concern  . Not on file   Social History Narrative   Caffeine: 2-3  cups coffee/day   Married 1975 and lives with husband   Occupation: Comptrollernursing teacher at [employer]   Activity: walks, hikes, gardening, wants to restart yoga   Diet: good water, fruits/vegetables daily, red meat 3-4 x/wk, fish 1x/wk    Family History  Problem Relation Age of Onset  . Hypertension Mother   . Alzheimer's disease Mother   . Diabetes Father   . Coronary artery disease Father [id number]       CABG x 4  . Hypertension Brother   . Alcohol abuse Brother   . Glaucoma Brother   . Arthritis Brother   . Obesity Brother        knee  . Cancer Neg Hx     Allergies  Allergen Reactions  . Bee Venom Swelling  . Sulfonamide Derivatives     REACTION: rash    Medication list reviewed and updated in full in [facility].  ROS: GEN: Acute illness details above GI: Tolerating PO intake GU: maintaining adequate hydration and urination Pulm: No SOB Interactive and getting along well at home.  Otherwise, ROS is as per the HPI.  Objective:   BP 110/60   Pulse 80   Temp 98.3 F (36.8 C) (Oral)   Ht 5\' 5"  (1.651 m)   Wt 171  lb 8 oz (77.8 kg)   BMI 28.54 kg/m    GEN: WDWN, NAD, Non-toxic, Alert & Oriented x 3 HEENT: Atraumatic, Normocephalic.  Ears and Nose: No external deformity. EXTR: No clubbing/cyanosis/edema NEURO: Normal gait.  PSYCH: Normally interactive. Conversant. Not depressed or anxious appearing.  Calm demeanor.    Decreased redness on the thumb and has some appropriate movement at the MCP joint with decreased motion at the DIP joint.  Scarring is present.  No warmth and minimal redness.   Laboratory and Imaging Data:  Assessment and Plan:   Cellulitis of thumb, right  Improving cellulitis, continue Augmentin.  I think that the lack of motion or decreased motion at the IP joint is likely secondary to her prior fracture.  Recommended range of motion now.  Follow-up: No Follow-up on file.  Future Appointments Date Time Provider Department Center  11/17/2016  1:30 PM Lorre Munroe, NP [facility]   Signed,  Karleen Hampshire T. Denene Alamillo, MD   Allergies as of 10/23/2016      Reactions   Bee Venom Swelling   Sulfonamide Derivatives    REACTION: rash      Medication List       Accurate as of 10/23/16 11:59 PM. Always use your most recent med list.          amoxicillin-clavulanate 875-125 MG tablet Commonly known as:  AUGMENTIN Take 1 tablet by mouth 2 (two) times daily.   COD LIVER OIL PO Take by mouth daily.   EPINEPHrine 0.3 mg/0.3 mL Devi Commonly known as:  EPIPEN Inject 0.3 mLs (0.3 mg total) into the muscle once. As directed   Fish Oil 1000 MG Caps Take by mouth daily.   multivitamin tablet Take 1 tablet by mouth daily.

## 2016-11-17 ENCOUNTER — Encounter: Payer: Self-pay | Admitting: Internal Medicine

## 2016-11-17 ENCOUNTER — Ambulatory Visit (INDEPENDENT_AMBULATORY_CARE_PROVIDER_SITE_OTHER): Payer: BC Managed Care – PPO | Admitting: Internal Medicine

## 2016-11-17 ENCOUNTER — Encounter (INDEPENDENT_AMBULATORY_CARE_PROVIDER_SITE_OTHER): Payer: Self-pay

## 2016-11-17 ENCOUNTER — Other Ambulatory Visit (HOSPITAL_COMMUNITY)
Admission: RE | Admit: 2016-11-17 | Discharge: 2016-11-17 | Disposition: A | Discharge status: Home / Self Care | Payer: BC Managed Care – PPO | Source: Ambulatory Visit | Attending: Internal Medicine | Admitting: Internal Medicine

## 2016-11-17 ENCOUNTER — Encounter: Payer: BC Managed Care – PPO | Admitting: Internal Medicine

## 2016-11-17 VITALS — BP 112/76 | HR 65 | Temp 98.1°F | Ht 65.5 in | Wt 170.0 lb

## 2016-11-17 DIAGNOSIS — Z13820 Encounter for screening for osteoporosis: Secondary | ICD-10-CM | POA: Insufficient documentation

## 2016-11-17 DIAGNOSIS — Z1231 Encounter for screening mammogram for malignant neoplasm of breast: Secondary | ICD-10-CM

## 2016-11-17 DIAGNOSIS — Z0000 Encounter for general adult medical examination without abnormal findings: Secondary | ICD-10-CM | POA: Diagnosis not present

## 2016-11-17 DIAGNOSIS — Z124 Encounter for screening for malignant neoplasm of cervix: Secondary | ICD-10-CM

## 2016-11-17 DIAGNOSIS — Z114 Encounter for screening for human immunodeficiency virus [HIV]: Secondary | ICD-10-CM

## 2016-11-17 DIAGNOSIS — Z1159 Encounter for screening for other viral diseases: Secondary | ICD-10-CM

## 2016-11-17 DIAGNOSIS — Z1239 Encounter for other screening for malignant neoplasm of breast: Secondary | ICD-10-CM

## 2016-11-17 LAB — VITAMIN D 25 HYDROXY (VIT D DEFICIENCY, FRACTURES): VITD: 42.1 ng/mL (ref 30.00–100.00)

## 2016-11-17 LAB — HEMOGLOBIN A1C: Hgb A1c MFr Bld: 5.9 % (ref 4.6–6.5)

## 2016-11-17 LAB — CBC
HEMATOCRIT: 42.7 % (ref 36.0–46.0)
Hemoglobin: 14 g/dL (ref 12.0–15.0)
MCHC: 32.9 g/dL (ref 30.0–36.0)
MCV: 91.4 fl (ref 78.0–100.0)
PLATELETS: 221 10*3/uL (ref 150.0–400.0)
RBC: 4.67 Mil/uL (ref 3.87–5.11)
RDW: 13.7 % (ref 11.5–15.5)
WBC: 5.3 10*3/uL (ref 4.0–10.5)

## 2016-11-17 LAB — COMPREHENSIVE METABOLIC PANEL
ALBUMIN: 4.4 g/dL (ref 3.5–5.2)
ALT: 17 U/L (ref 0–35)
AST: 16 U/L (ref 0–37)
Alkaline Phosphatase: 66 U/L (ref 39–117)
BUN: 16 mg/dL (ref 6–23)
CALCIUM: 9.6 mg/dL (ref 8.4–10.5)
CHLORIDE: 104 meq/L (ref 96–112)
CO2: 29 mEq/L (ref 19–32)
Creatinine, Ser: 0.82 mg/dL (ref 0.40–1.20)
GFR: 74.67 mL/min (ref >60.00)
Glucose, Bld: 102 mg/dL — ABNORMAL HIGH (ref 70–99)
POTASSIUM: 4.4 meq/L (ref 3.5–5.1)
Sodium: 140 mEq/L (ref 135–145)
Total Bilirubin: 0.5 mg/dL (ref 0.2–1.2)
Total Protein: 7.6 g/dL (ref 6.0–8.3)

## 2016-11-17 LAB — LIPID PANEL
CHOLESTEROL: 203 mg/dL — AB (ref 0–200)
HDL: 59 mg/dL (ref >39.00)
LDL CALC: 126 mg/dL — AB (ref 0–99)
NonHDL: 144.34
TRIGLYCERIDES: 94 mg/dL (ref 0.0–149.0)
Total CHOL/HDL Ratio: 3
VLDL: 18.8 mg/dL (ref 0.0–40.0)

## 2016-11-17 NOTE — Progress Notes (Signed)
Subjective:    Patient ID: Cheryl Melendez, female    DOB: May 01, 1950, 66 y.o.   MRN: [id number]  HPI  Pt presents to the clinic today for her annual exam.   Flu: 01/2016 Tetanus: 09/2016 [facility] Zostavax: never Pap Smear: 11/2011 Mammogram: 11/2011 Bone Density Exam: never Colon Screening: never Vision Screening: annually Dentist: biannually  Diet: She does eat meat. She consumes fruits and veggies. She occasionally eats fried foods. She drinks mostly water, some coffee and soda. Exercise: None  Review of Systems      Past Medical History:  Diagnosis Date  . HLD (hyperlipidemia)     Current Outpatient Prescriptions  Medication Sig Dispense Refill  . COD LIVER OIL PO Take by mouth daily.      Marland Kitchen. EPINEPHrine (EPIPEN) 0.3 mg/0.3 mL DEVI Inject 0.3 mLs (0.3 mg total) into the muscle once. As directed 1 Device 1  . Multiple Vitamin (MULTIVITAMIN) tablet Take 1 tablet by mouth daily.      . Omega-3 Fatty Acids (FISH OIL) 1000 MG CAPS Take by mouth daily.       No current facility-administered medications for this visit.     Allergies  Allergen Reactions  . Bee Venom Swelling  . Sulfonamide Derivatives     REACTION: rash    Family History  Problem Relation Age of Onset  . Hypertension Mother   . Alzheimer's disease Mother   . Diabetes Father   . Coronary artery disease Father [id number]       CABG x 4  . Hypertension Brother   . Alcohol abuse Brother   . Glaucoma Brother   . Arthritis Brother   . Obesity Brother        knee  . Cancer Neg Hx     Social History   Social History  . Marital status: Married    Spouse name: N/A  . Number of children: 2  . Years of education: N/A   Occupational History  . Nursing Teacher     [employer]   Social History Main Topics  . Smoking status: Never Smoker  . Smokeless tobacco: Never Used  . Alcohol use Yes     Comment: occ glass of wine  . Drug use: No  . Sexual activity: Yes   Other Topics Concern  .  Not on file   Social History Narrative   Caffeine: 2-3 cups coffee/day   Married 1975 and lives with husband   Occupation: Comptrollernursing teacher at [employer]   Activity: walks, hikes, gardening, wants to restart yoga   Diet: good water, fruits/vegetables daily, red meat 3-4 x/wk, fish 1x/wk     Constitutional: Denies fever, malaise, fatigue, headache or abrupt weight changes.  HEENT: Denies eye pain, eye redness, ear pain, ringing in the ears, wax buildup, runny nose, nasal congestion, bloody nose, or sore throat. Respiratory: Denies difficulty breathing, shortness of breath, cough or sputum production.   Cardiovascular: Denies chest pain, chest tightness, palpitations or swelling in the hands or feet.  Gastrointestinal: Denies abdominal pain, bloating, constipation, diarrhea or blood in the stool.  GU: Denies urgency, frequency, pain with urination, burning sensation, blood in urine, odor or discharge. Musculoskeletal: Denies decrease in range of motion, difficulty with gait, muscle pain or joint pain and swelling.  Skin: Denies redness, rashes, lesions or ulcercations.  Neurological: Denies dizziness, difficulty with memory, difficulty with speech or problems with balance and coordination.  Psych: Denies anxiety, depression, SI/HI.  No other specific complaints in  a complete review of systems (except as listed in HPI above).  Objective:   Physical Exam BP 112/76   Pulse 65   Temp 98.1 F (36.7 C) (Oral)   Ht 5' 5.5" (1.664 m)   Wt 170 lb (77.1 kg)   SpO2 98%   BMI 27.86 kg/m  Wt Readings from Last 3 Encounters:  11/17/16 170 lb (77.1 kg)  10/23/16 171 lb 8 oz (77.8 kg)  10/20/16 170 lb (77.1 kg)    General: Appears her stated age, well developed, well nourished in NAD. Skin: Warm, dry and intact.  HEENT: Head: normal shape and size; Eyes: sclera white, no icterus, conjunctiva pink, PERRLA and EOMs intact; Ears: Tm's gray and intact, normal light reflex;  Throat/Mouth: Teeth present,  mucosa pink and moist, no exudate, lesions or ulcerations noted.  Neck:  Neck supple, trachea midline. No masses, lumps or thyromegaly present.  Cardiovascular: Normal rate and rhythm. S1,S2 noted.  No murmur, rubs or gallops noted. No JVD or BLE edema. No carotid bruits noted. Pulmonary/Chest: Normal effort and positive vesicular breath sounds. No respiratory distress. No wheezes, rales or ronchi noted.  Abdomen: Soft and nontender. Normal bowel sounds. No distention or masses noted. Liver, spleen and kidneys non palpable. Pelvic: Normal female anatomy. Cervix friable. No discharge noted. Adnexa nonpalpable. Musculoskeletal: Strength 5/5 BEU/BLE. No difficulty with gait.  Neurological: Alert and oriented. Cranial nerves II-XII grossly intact. Coordination normal.  Psychiatric: Mood and affect normal. Behavior is normal. Judgment and thought content normal.     BMET    Component Value Date/Time   NA 139 12/03/2011 0858   K 4.4 12/03/2011 0858   CL 104 12/03/2011 0858   CO2 29 12/03/2011 0858   GLUCOSE 89 12/03/2011 0858   BUN 18 12/03/2011 0858   CREATININE 0.8 12/03/2011 0858   CALCIUM 9.3 12/03/2011 0858   GFRNONAA 78.62 01/15/2010 0841   GFRAA 84 01/21/2007 0915    Lipid Panel     Component Value Date/Time   CHOL 201 (H) 12/03/2011 0858   TRIG 70.0 12/03/2011 0858   HDL 64.50 12/03/2011 0858   CHOLHDL 3 12/03/2011 0858   VLDL 14.0 12/03/2011 0858   LDLCALC 116 (H) 10/02/2008 0827    CBC    Component Value Date/Time   HGB 15.3 (H) 12/16/2006 0945    Hgb A1C No results found for: HGBA1C          Assessment & Plan:   Preventative Health Maintenance:  Encouraged her to get a flu shot in the fall Tetanus UTD Pap smear today, she declines STD screening Mammogram and bone density ordered, she will call [facility] to schedule, number provided She declines colonoscopy but is agreeable to Cologuard, ordered Encouraged her to consume a balanced diet and exercise  regimen Advised her to see an eye doctor and dentist annually Will check CBC, CMET, Lipid, A1C, Vit D, HIV and Hep C today  RTC in 1 year, sooner if needed Nicki ReaperBAITY, REGINA, NP

## 2016-11-17 NOTE — Addendum Note (Signed)
Addended by: Roena MaladyEVONTENNO, Merrit Waugh Y on: 11/17/2016 12:33 PM   Modules accepted: Orders

## 2016-11-17 NOTE — Patient Instructions (Signed)
Health Maintenance for Postmenopausal Women Menopause is a normal process in which your reproductive ability comes to an end. This process happens gradually over a span of months to years, usually between the ages of 22 and 9. Menopause is complete when you have missed 12 consecutive menstrual periods. It is important to talk with your health care provider about some of the most common conditions that affect postmenopausal women, such as heart disease, cancer, and bone loss (osteoporosis). Adopting a healthy lifestyle and getting preventive care can help to promote your health and wellness. Those actions can also lower your chances of developing some of these common conditions. What should I know about menopause? During menopause, you may experience a number of symptoms, such as:  Moderate-to-severe hot flashes.  Night sweats.  Decrease in sex drive.  Mood swings.  Headaches.  Tiredness.  Irritability.  Memory problems.  Insomnia.  Choosing to treat or not to treat menopausal changes is an individual decision that you make with your health care provider. What should I know about hormone replacement therapy and supplements? Hormone therapy products are effective for treating symptoms that are associated with menopause, such as hot flashes and night sweats. Hormone replacement carries certain risks, especially as you become older. If you are thinking about using estrogen or estrogen with progestin treatments, discuss the benefits and risks with your health care provider. What should I know about heart disease and stroke? Heart disease, heart attack, and stroke become more likely as you age. This may be due, in part, to the hormonal changes that your body experiences during menopause. These can affect how your body processes dietary fats, triglycerides, and cholesterol. Heart attack and stroke are both medical emergencies. There are many things that you can do to help prevent heart disease  and stroke:  Have your blood pressure checked at least every 1-2 years. High blood pressure causes heart disease and increases the risk of stroke.  If you are 53-22 years old, ask your health care provider if you should take aspirin to prevent a heart attack or a stroke.  Do not use any tobacco products, including cigarettes, chewing tobacco, or electronic cigarettes. If you need help quitting, ask your health care provider.  It is important to eat a healthy diet and maintain a healthy weight. ? Be sure to include plenty of vegetables, fruits, low-fat dairy products, and lean protein. ? Avoid eating foods that are high in solid fats, added sugars, or salt (sodium).  Get regular exercise. This is one of the most important things that you can do for your health. ? Try to exercise for at least 150 minutes each week. The type of exercise that you do should increase your heart rate and make you sweat. This is known as moderate-intensity exercise. ? Try to do strengthening exercises at least twice each week. Do these in addition to the moderate-intensity exercise.  Know your numbers.Ask your health care provider to check your cholesterol and your blood glucose. Continue to have your blood tested as directed by your health care provider.  What should I know about cancer screening? There are several types of cancer. Take the following steps to reduce your risk and to catch any cancer development as early as possible. Breast Cancer  Practice breast self-awareness. ? This means understanding how your breasts normally appear and feel. ? It also means doing regular breast self-exams. Let your health care provider know about any changes, no matter how small.  If you are 40  or older, have a clinician do a breast exam (clinical breast exam or CBE) every year. Depending on your age, family history, and medical history, it may be recommended that you also have a yearly breast X-ray (mammogram).  If you  have a family history of breast cancer, talk with your health care provider about genetic screening.  If you are at high risk for breast cancer, talk with your health care provider about having an MRI and a mammogram every year.  Breast cancer (BRCA) gene test is recommended for women who have family members with BRCA-related cancers. Results of the assessment will determine the need for genetic counseling and BRCA1 and for BRCA2 testing. BRCA-related cancers include these types: ? Breast. This occurs in males or females. ? Ovarian. ? Tubal. This may also be called fallopian tube cancer. ? Cancer of the abdominal or pelvic lining (peritoneal cancer). ? Prostate. ? Pancreatic.  Cervical, Uterine, and Ovarian Cancer Your health care provider may recommend that you be screened regularly for cancer of the pelvic organs. These include your ovaries, uterus, and vagina. This screening involves a pelvic exam, which includes checking for microscopic changes to the surface of your cervix (Pap test).  For women ages 21-65, health care providers may recommend a pelvic exam and a Pap test every three years. For women ages 79-65, they may recommend the Pap test and pelvic exam, combined with testing for human papilloma virus (HPV), every five years. Some types of HPV increase your risk of cervical cancer. Testing for HPV may also be done on women of any age who have unclear Pap test results.  Other health care providers may not recommend any screening for nonpregnant women who are considered low risk for pelvic cancer and have no symptoms. Ask your health care provider if a screening pelvic exam is right for you.  If you have had past treatment for cervical cancer or a condition that could lead to cancer, you need Pap tests and screening for cancer for at least 20 years after your treatment. If Pap tests have been discontinued for you, your risk factors (such as having a new sexual partner) need to be  reassessed to determine if you should start having screenings again. Some women have medical problems that increase the chance of getting cervical cancer. In these cases, your health care provider may recommend that you have screening and Pap tests more often.  If you have a family history of uterine cancer or ovarian cancer, talk with your health care provider about genetic screening.  If you have vaginal bleeding after reaching menopause, tell your health care provider.  There are currently no reliable tests available to screen for ovarian cancer.  Lung Cancer Lung cancer screening is recommended for adults 69-62 years old who are at high risk for lung cancer because of a history of smoking. A yearly low-dose CT scan of the lungs is recommended if you:  Currently smoke.  Have a history of at least 30 pack-years of smoking and you currently smoke or have quit within the past 15 years. A pack-year is smoking an average of one pack of cigarettes per day for one year.  Yearly screening should:  Continue until it has been 15 years since you quit.  Stop if you develop a health problem that would prevent you from having lung cancer treatment.  Colorectal Cancer  This type of cancer can be detected and can often be prevented.  Routine colorectal cancer screening usually begins at  age 42 and continues through age 45.  If you have risk factors for colon cancer, your health care provider may recommend that you be screened at an earlier age.  If you have a family history of colorectal cancer, talk with your health care provider about genetic screening.  Your health care provider may also recommend using home test kits to check for hidden blood in your stool.  A small camera at the end of a tube can be used to examine your colon directly (sigmoidoscopy or colonoscopy). This is done to check for the earliest forms of colorectal cancer.  Direct examination of the colon should be repeated every  5-10 years until age 71. However, if early forms of precancerous polyps or small growths are found or if you have a family history or genetic risk for colorectal cancer, you may need to be screened more often.  Skin Cancer  Check your skin from head to toe regularly.  Monitor any moles. Be sure to tell your health care provider: ? About any new moles or changes in moles, especially if there is a change in a mole's shape or color. ? If you have a mole that is larger than the size of a pencil eraser.  If any of your family members has a history of skin cancer, especially at a young age, talk with your health care provider about genetic screening.  Always use sunscreen. Apply sunscreen liberally and repeatedly throughout the day.  Whenever you are outside, protect yourself by wearing long sleeves, pants, a wide-brimmed hat, and sunglasses.  What should I know about osteoporosis? Osteoporosis is a condition in which bone destruction happens more quickly than new bone creation. After menopause, you may be at an increased risk for osteoporosis. To help prevent osteoporosis or the bone fractures that can happen because of osteoporosis, the following is recommended:  If you are 46-71 years old, get at least 1,000 mg of calcium and at least 600 mg of vitamin D per day.  If you are older than age 55 but younger than age 65, get at least 1,200 mg of calcium and at least 600 mg of vitamin D per day.  If you are older than age 54, get at least 1,200 mg of calcium and at least 800 mg of vitamin D per day.  Smoking and excessive alcohol intake increase the risk of osteoporosis. Eat foods that are rich in calcium and vitamin D, and do weight-bearing exercises several times each week as directed by your health care provider. What should I know about how menopause affects my mental health? Depression may occur at any age, but it is more common as you become older. Common symptoms of depression  include:  Low or sad mood.  Changes in sleep patterns.  Changes in appetite or eating patterns.  Feeling an overall lack of motivation or enjoyment of activities that you previously enjoyed.  Frequent crying spells.  Talk with your health care provider if you think that you are experiencing depression. What should I know about immunizations? It is important that you get and maintain your immunizations. These include:  Tetanus, diphtheria, and pertussis (Tdap) booster vaccine.  Influenza every year before the flu season begins.  Pneumonia vaccine.  Shingles vaccine.  Your health care provider may also recommend other immunizations. This information is not intended to replace advice given to you by your health care provider. Make sure you discuss any questions you have with your health care provider. Document Released: 05/30/2005  Document Revised: 10/26/2015 Document Reviewed: 01/09/2015 Elsevier Interactive Patient Education  2018 [employer] Inc[employer]  

## 2016-11-18 ENCOUNTER — Telehealth: Payer: Self-pay

## 2016-11-18 LAB — HIV ANTIBODY (ROUTINE TESTING W REFLEX): HIV: NONREACTIVE

## 2016-11-18 LAB — HEPATITIS C ANTIBODY: HCV Ab: NEGATIVE

## 2016-11-18 NOTE — Telephone Encounter (Signed)
Cologuard form has been faxed w/ demographics and insurance card attached 

## 2016-11-19 LAB — CYTOLOGY - PAP
DIAGNOSIS: NEGATIVE
HPV (WINDOPATH): NOT DETECTED

## 2016-12-01 ENCOUNTER — Encounter: Payer: Self-pay | Admitting: Internal Medicine

## 2016-12-01 LAB — COLOGUARD

## 2016-12-09 ENCOUNTER — Telehealth: Payer: Self-pay

## 2016-12-09 NOTE — Telephone Encounter (Signed)
Cologuard (DNA stool test) result:  NEGATIVE Letter mailed to notify pt

## 2016-12-25 ENCOUNTER — Encounter: Payer: Self-pay | Admitting: Internal Medicine

## 2016-12-30 ENCOUNTER — Encounter: Payer: Self-pay | Admitting: Internal Medicine

## 2017-01-07 ENCOUNTER — Telehealth: Payer: Self-pay | Admitting: Internal Medicine

## 2017-01-07 NOTE — Telephone Encounter (Signed)
Pt has appt on 01/09/17 at 8:15 am with Dr Reece Agar.

## 2017-01-07 NOTE — Telephone Encounter (Signed)
noted 

## 2017-01-07 NOTE — Telephone Encounter (Signed)
[facility] Day - Client TELEPHONE ADVICE RECORD [facility]  Patient Name: Cheryl Melendez  DOB: 18-Apr-1951    Initial Comment Pt states she is falling more than she should- fell and hit head   Nurse Assessment  Nurse: Clarita Leber, RN, Gavin Pound Date/Time (Eastern Time): 01/07/2017 8:13:50 AM  Confirm and document reason for call. If symptomatic, describe symptoms. ---The patient stated that she is feeling like she is falling more than she should. She states that last night and hit the back of her head on the bedside table. She denies loss of conscious and no bleeding. She states that she has fallen 6 or 7 times in the last 6 months.  Does the patient have any new or worsening symptoms? ---Yes  Will a triage be completed? ---Yes  Related visit to physician within the last 2 weeks? ---Yes  Does the PT have any chronic conditions? (i.e. diabetes, asthma, etc.) ---No  Is this a behavioral health or substance abuse call? ---No     Guidelines    Guideline Title Affirmed Question Affirmed Notes  Head Injury Scalp swelling, bruise or pain    Final Disposition User   Home Care Clarita Leber, RN, Gavin Pound    Disagree/Comply: Comply

## 2017-01-08 ENCOUNTER — Ambulatory Visit: Payer: Self-pay | Admitting: Family Medicine

## 2017-01-09 ENCOUNTER — Encounter: Payer: Self-pay | Admitting: Family Medicine

## 2017-01-09 ENCOUNTER — Ambulatory Visit (INDEPENDENT_AMBULATORY_CARE_PROVIDER_SITE_OTHER): Payer: BC Managed Care – PPO | Admitting: Family Medicine

## 2017-01-09 ENCOUNTER — Other Ambulatory Visit: Payer: BC Managed Care – PPO

## 2017-01-09 DIAGNOSIS — G3281 Cerebellar ataxia in diseases classified elsewhere: Secondary | ICD-10-CM

## 2017-01-09 DIAGNOSIS — R296 Repeated falls: Secondary | ICD-10-CM

## 2017-01-09 DIAGNOSIS — Z23 Encounter for immunization: Secondary | ICD-10-CM | POA: Diagnosis not present

## 2017-01-09 DIAGNOSIS — R270 Ataxia, unspecified: Secondary | ICD-10-CM

## 2017-01-09 LAB — TSH: TSH: 1.95 u[IU]/mL (ref 0.35–4.50)

## 2017-01-09 LAB — VITAMIN B12: VITAMIN B 12: 246 pg/mL (ref 211–911)

## 2017-01-09 LAB — SEDIMENTATION RATE: SED RATE: 17 mm/h (ref 0–30)

## 2017-01-09 NOTE — Assessment & Plan Note (Signed)
Multiple falls in last 6 months, along with hyperreflexia and possible choreiform movements of unclear cause. Will start eval with labwork (TSH, B12, lyme disease testing, ESR). Will check head CT and refer to neurology for further evaluation. Pt agrees with plan.

## 2017-01-09 NOTE — Addendum Note (Signed)
Addended by: Alvina Chou on: 01/09/2017 03:15 PM   Modules accepted: Orders

## 2017-01-09 NOTE — Progress Notes (Signed)
BP 118/70 (BP Location: Left Arm, Patient Position: Sitting, Cuff Size: Normal)   Pulse 81   Temp 98 F (36.7 C) (Oral)   Wt 169 lb (76.7 kg)   SpO2 98%   BMI 27.70 kg/m    CC: recurrent falls Subjective:    Patient ID: Cheryl Melendez, female    DOB: [birth year], 66 y.o.   MRN: [id number]  HPI: Cheryl Melendez is a 65 y.o. female presenting on 01/09/2017 for Fall [place] 01/07/17 getting out of bed. Hit back of head and right ear. Says she has fallen a few times)   "It seems I've been falling more than I should be". Fell outdoors down 5 concrete steps at home 08/2016. Injured hands, fractured R thumb, bruised right thigh and busted lip. 2 months ago fell at home in bathroom at night time (may not have been fully awake). Also had 2 more falls at home going down steps. [place] this past Wednesday at home, at night getting up to urinate (may not have been fully awake), hit occiput and right ear against bedside table. Feet feel restless like "they're jumping" over last few months. No residual headache.   No premonitory sxs with falls, no dizziness, syncope or presyncope, vertigo, lightheadedness or leg weakness. Denies gait imbalance/instability. Denies paresthesias or numbness. Denies fevers/chills, nausea, tremors, urinary incontinence, denies memory concerns. No slurred speech. Sense of smell is intact.   Took 2 mile walk this morning and did fine.  She has had tick bites over the years but no tick borne illness symptoms.   6-7 falls in last 6 months. Referred to PT for fall prevention program 09/2016 - had one session - recommended HEP.   No fmhx neuromuscular disease  Non smoker Alcohol - prior glass of wine occasionally, completely quit 1 month ago.  No recreational drugs.   Relevant past medical, surgical, family and social history reviewed and updated as indicated. Interim medical history since our last visit reviewed. Allergies and medications reviewed and updated. Outpatient  Medications Prior to Visit  Medication Sig Dispense Refill  . Calcium Carb-Cholecalciferol (CALCIUM 500+D PO) Take 1 tablet by mouth daily.    . COD LIVER OIL PO Take by mouth daily.      Marland Kitchen EPINEPHrine (EPIPEN) 0.3 mg/0.3 mL DEVI Inject 0.3 mLs (0.3 mg total) into the muscle once. As directed 1 Device 1  . Multiple Vitamin (MULTIVITAMIN) tablet Take 1 tablet by mouth daily.      . Omega-3 Fatty Acids (FISH OIL) 1000 MG CAPS Take by mouth daily.       No facility-administered medications prior to visit.     Past Medical History:  Diagnosis Date  . HLD (hyperlipidemia)     Past Surgical History:  Procedure Laterality Date  . CESAREAN SECTION     x 1  . CYSTECTOMY  1990's   L branchial cleft  . excision of mucoid cyst  12/16/2006   left thumb  . HAND SURGERY Right 05/2014   R finger PIP joint dislocation s/p pinning [facility])  . MRI back  2004   L4/5 ruptured disc with radiculopathy  . VAGINAL BIRTH AFTER CESAREAN SECTION     x 1    Family History  Problem Relation Age of Onset  . Hypertension Mother   . Alzheimer's disease Mother   . Diabetes Father   . Coronary artery disease Father 56       CABG x 4  . Hypertension Brother   . Alcohol  abuse Brother   . Glaucoma Brother   . Arthritis Brother   . Obesity Brother        knee  . Cancer Neg Hx     Social History  Substance Use Topics  . Smoking status: Never Smoker  . Smokeless tobacco: Never Used  . Alcohol use Yes     Comment: occ glass of wine    Per HPI unless specifically indicated in ROS section below Review of Systems     Objective:    BP 118/70 (BP Location: Left Arm, Patient Position: Sitting, Cuff Size: Normal)   Pulse 81   Temp 98 F (36.7 C) (Oral)   Wt 169 lb (76.7 kg)   SpO2 98%   BMI 27.70 kg/m   Wt Readings from Last 3 Encounters:  01/09/17 169 lb (76.7 kg)  11/17/16 170 lb (77.1 kg)  10/23/16 171 lb 8 oz (77.8 kg)    Physical Exam  Constitutional: She is oriented to person, place, and  time. She appears well-developed and well-nourished. No distress.  HENT:  Head: Normocephalic and atraumatic.  Right Ear: Hearing, tympanic membrane, external ear and ear canal normal.  Left Ear: Hearing, tympanic membrane, external ear and ear canal normal.  Mouth/Throat: Oropharynx is clear and moist. No oropharyngeal exudate.  Eyes: Pupils are equal, round, and reactive to light. Conjunctivae and EOM are normal. No scleral icterus.  Neck: Normal range of motion. Neck supple. No thyromegaly present.  Cardiovascular: Normal rate, regular rhythm, normal heart sounds and intact distal pulses.   No murmur heard. Pulmonary/Chest: Effort normal and breath sounds normal. No respiratory distress. She has no wheezes. She has no rales.  Musculoskeletal: She exhibits no edema.  Lymphadenopathy:    She has no cervical adenopathy.  Neurological: She is alert and oriented to person, place, and time. She has normal strength. She displays abnormal reflex. No cranial nerve deficit or sensory deficit. Coordination abnormal. Gait normal.  Reflex Scores:      Bicep reflexes are 4+ on the right side and 4+ on the left side.      Patellar reflexes are 4+ on the right side and 4+ on the left side. CN 2-12 intact EOMI No cogwheel rigidity FTN intact Difficulty with fine motor movements Unsteady with romberg 5/5 strength BUE and BLE, brusque gross movements Some choreiform movement of face, hands, feet Marked difficulty with heel walking  Skin: Skin is dry. No rash noted.  Psychiatric: She has a normal mood and affect.  Nursing note and vitals reviewed.  Lab Results  Component Value Date   TSH 2.39 10/02/2008       Assessment & Plan:   Problem List Items Addressed This Visit    Ataxia - Primary    Multiple falls in last 6 months, along with hyperreflexia and possible choreiform movements of unclear cause. Will start eval with labwork (TSH, B12, lyme disease testing, ESR). Will check head CT and  refer to neurology for further evaluation. Pt agrees with plan.       Relevant Orders   CT Head Wo Contrast   Vitamin B12   TSH   Ambulatory referral to Neurology   Sedimentation rate   Lyme Ab/Western Blot Reflex    Other Visit Diagnoses    Cerebellar ataxia in diseases classified elsewhere [facility])       Relevant Orders   CT Head Wo Contrast   Ambulatory referral to Neurology   Need for influenza vaccination  Relevant Orders   Flu Vaccine QUAD 6+ mos PF IM (Fluarix Quad PF) (Completed)   Recurrent falls           Follow up plan: Return if symptoms worsen or fail to improve.  Ria Bush, MD

## 2017-01-09 NOTE — Patient Instructions (Addendum)
Flu shot today Labs today. We will also order CT scan of brain. We will refer you to neurology for further evaluation of frequent falls. Good to see you today, call us with questions.

## 2017-01-09 NOTE — Addendum Note (Signed)
Addended by: Alvina Chou on: 01/09/2017 03:29 PM   Modules accepted: Orders

## 2017-01-10 ENCOUNTER — Other Ambulatory Visit: Payer: Self-pay | Admitting: Family Medicine

## 2017-01-10 MED ORDER — VITAMIN B-12 1000 MCG PO TABS: 1000.0000 ug | ORAL_TABLET | Freq: Every day | ORAL | Status: DC

## 2017-01-12 ENCOUNTER — Inpatient Hospital Stay: Admission: RE | Admit: 2017-01-12 | Payer: BC Managed Care – PPO | Source: Ambulatory Visit

## 2017-01-12 ENCOUNTER — Telehealth: Payer: Self-pay | Admitting: Internal Medicine

## 2017-01-12 NOTE — Telephone Encounter (Signed)
Spoke with pt, relayed results and message per Dr. G.  

## 2017-01-12 NOTE — Telephone Encounter (Signed)
Patient called to get her lab results.  Please call patient back.

## 2017-01-13 LAB — B. BURGDORFI ANTIBODIES BY WB
B burgdorferi IgG Abs (IB): NEGATIVE
B burgdorferi IgM Abs (IB): NEGATIVE
LYME DISEASE 18 KD IGG: NONREACTIVE
LYME DISEASE 23 KD IGG: NONREACTIVE
LYME DISEASE 23 KD IGM: NONREACTIVE
LYME DISEASE 39 KD IGM: NONREACTIVE
LYME DISEASE 41 KD IGG: NONREACTIVE
LYME DISEASE 58 KD IGG: NONREACTIVE
Lyme Disease 28 kD IgG: NONREACTIVE
Lyme Disease 30 kD IgG: NONREACTIVE
Lyme Disease 39 kD IgG: NONREACTIVE
Lyme Disease 41 kD IgM: NONREACTIVE
Lyme Disease 45 kD IgG: NONREACTIVE
Lyme Disease 66 kD IgG: NONREACTIVE
Lyme Disease 93 kD IgG: NONREACTIVE

## 2017-01-13 NOTE — Telephone Encounter (Signed)
Patient is asking for Misty Stanley to call her back about her lab results.  Patient can be reached at [phone].

## 2017-01-13 NOTE — Telephone Encounter (Signed)
Spoke with pt, she wants to inform Dr. Reece Agar the vitamin B12 is helping already. Says she feels really good and for right now, does not want to proceed with neuro referral.

## 2017-01-14 ENCOUNTER — Inpatient Hospital Stay: Admission: RE | Admit: 2017-01-14 | Payer: BC Managed Care – PPO | Source: Ambulatory Visit

## 2017-01-14 ENCOUNTER — Telehealth: Payer: Self-pay

## 2017-01-14 DIAGNOSIS — R270 Ataxia, unspecified: Secondary | ICD-10-CM

## 2017-01-14 NOTE — Telephone Encounter (Signed)
Patient returned call.  Please call patient back at [phone].

## 2017-01-14 NOTE — Telephone Encounter (Addendum)
I also see she cancelled head CT.  I'm glad B12 is helping but I think there's some other neurological process going on outside of that attributable to low b12 level. For this reason I do recommend she keep appointment with neurology.  Tried to call patient, straight to voicemail.  plz notify of above. Will cc PCP.

## 2017-01-14 NOTE — Telephone Encounter (Signed)
Spoke with pt relayed message per Dr. Reece Agar that he strongly recommends she have neuro eval. Pt reluctantly agrees to referral still stating she feels so much better with the vit B12.

## 2017-01-14 NOTE — Telephone Encounter (Signed)
noted 

## 2017-01-14 NOTE — Telephone Encounter (Signed)
Noted.  Thank you.  Referral placed. 

## 2017-01-23 ENCOUNTER — Inpatient Hospital Stay: Admission: RE | Admit: 2017-01-23 | Payer: BC Managed Care – PPO | Source: Ambulatory Visit

## 2017-11-17 ENCOUNTER — Ambulatory Visit: Payer: BC Managed Care – PPO | Admitting: Internal Medicine

## 2018-01-12 ENCOUNTER — Encounter: Payer: BC Managed Care – PPO | Admitting: Internal Medicine

## 2018-01-14 ENCOUNTER — Encounter: Payer: Self-pay | Admitting: Internal Medicine

## 2018-01-14 ENCOUNTER — Ambulatory Visit (INDEPENDENT_AMBULATORY_CARE_PROVIDER_SITE_OTHER): Payer: BC Managed Care – PPO | Admitting: Internal Medicine

## 2018-01-14 VITALS — BP 126/78 | HR 74 | Temp 98.3°F | Ht 65.0 in | Wt 159.0 lb

## 2018-01-14 DIAGNOSIS — E7800 Pure hypercholesterolemia, unspecified: Secondary | ICD-10-CM | POA: Diagnosis not present

## 2018-01-14 DIAGNOSIS — Z0000 Encounter for general adult medical examination without abnormal findings: Secondary | ICD-10-CM | POA: Diagnosis not present

## 2018-01-14 DIAGNOSIS — Z23 Encounter for immunization: Secondary | ICD-10-CM | POA: Diagnosis not present

## 2018-01-14 LAB — COMPREHENSIVE METABOLIC PANEL
ALBUMIN: 4.6 g/dL (ref 3.5–5.2)
ALK PHOS: 65 U/L (ref 39–117)
ALT: 17 U/L (ref 0–35)
AST: 15 U/L (ref 0–37)
BILIRUBIN TOTAL: 0.6 mg/dL (ref 0.2–1.2)
BUN: 16 mg/dL (ref 6–23)
CALCIUM: 9.8 mg/dL (ref 8.4–10.5)
CO2: 29 mEq/L (ref 19–32)
CREATININE: 0.84 mg/dL (ref 0.40–1.20)
Chloride: 103 mEq/L (ref 96–112)
GFR: 72.36 mL/min (ref >60.00)
Glucose, Bld: 98 mg/dL (ref 70–99)
Potassium: 4.2 mEq/L (ref 3.5–5.1)
SODIUM: 139 meq/L (ref 135–145)
TOTAL PROTEIN: 7.6 g/dL (ref 6.0–8.3)

## 2018-01-14 LAB — LIPID PANEL
CHOLESTEROL: 199 mg/dL (ref 0–200)
HDL: 63.1 mg/dL (ref >39.00)
LDL Cholesterol: 103 mg/dL — ABNORMAL HIGH (ref 0–99)
NonHDL: 135.47
TRIGLYCERIDES: 162 mg/dL — AB (ref 0.0–149.0)
Total CHOL/HDL Ratio: 3
VLDL: 32.4 mg/dL (ref 0.0–40.0)

## 2018-01-14 LAB — CBC
HCT: 42 % (ref 36.0–46.0)
Hemoglobin: 14.1 g/dL (ref 12.0–15.0)
MCHC: 33.6 g/dL (ref 30.0–36.0)
MCV: 88.5 fl (ref 78.0–100.0)
PLATELETS: 231 10*3/uL (ref 150.0–400.0)
RBC: 4.75 Mil/uL (ref 3.87–5.11)
RDW: 14 % (ref 11.5–15.5)
WBC: 5.3 10*3/uL (ref 4.0–10.5)

## 2018-01-14 LAB — VITAMIN D 25 HYDROXY (VIT D DEFICIENCY, FRACTURES): VITD: 40.55 ng/mL (ref 30.00–100.00)

## 2018-01-14 NOTE — Progress Notes (Signed)
Subjective:    Patient ID: Cheryl Melendez, female    DOB: [birth year], 67 y.o.   MRN: [id number]  HPI  Pt presents to the clinic today for her annual exam.   HLD: Her last LDL was 126, 10/2016. She is taking Cod Liver Oil OTC. She tries to consume a low fat diet.  Flu: 12/2016 Tetanus: 09/2016 Zostavax: never Shingrix: never Mammogram: 12/2016 Pap Smear: 10/2016 Bone Density: 12/2016 Colon Screening: Cologuard 11/2016 Vision Screening: every 1-2 years Dentist: biannually  Diet: She does eat meat. She consumes fruits and veggies daily. She does not eat a lot of fried foods. She drinks mostly water. Exercise: Walking   Review of Systems  Past Medical History:  Diagnosis Date  . HLD (hyperlipidemia)     Current Outpatient Medications  Medication Sig Dispense Refill  . Calcium Carb-Cholecalciferol (CALCIUM 500+D PO) Take 1 tablet by mouth daily.    . COD LIVER OIL PO Take by mouth daily.      Marland Kitchen EPINEPHrine (EPIPEN) 0.3 mg/0.3 mL DEVI Inject 0.3 mLs (0.3 mg total) into the muscle once. As directed 1 Device 1  . Multiple Vitamin (MULTIVITAMIN) tablet Take 1 tablet by mouth daily.      . Omega-3 Fatty Acids (FISH OIL) 1000 MG CAPS Take by mouth daily.      . vitamin B-12 (CYANOCOBALAMIN) 1000 MCG tablet Take 1 tablet (1,000 mcg total) by mouth daily.     No current facility-administered medications for this visit.     Allergies  Allergen Reactions  . Bee Venom Swelling  . Sulfonamide Derivatives     REACTION: rash    Family History  Problem Relation Age of Onset  . Hypertension Mother   . Alzheimer's disease Mother   . Diabetes Father   . Coronary artery disease Father 63       CABG x 4  . Hypertension Brother   . Alcohol abuse Brother   . Glaucoma Brother   . Arthritis Brother   . Obesity Brother        knee  . Cancer Neg Hx     Social History   Socioeconomic History  . Marital status: Married    Spouse name: Not on file  . Number of children: 2  . Years  of education: Not on file  . Highest education level: Not on file  Occupational History  . Occupation: Comptroller    Comment: Designer, multimedia  Social Needs  . Financial resource strain: Not on file  . Food insecurity:    Worry: Not on file    Inability: Not on file  . Transportation needs:    Medical: Not on file    Non-medical: Not on file  Tobacco Use  . Smoking status: Never Smoker  . Smokeless tobacco: Never Used  Substance and Sexual Activity  . Alcohol use: Yes    Comment: occ glass of wine  . Drug use: No  . Sexual activity: Yes  Lifestyle  . Physical activity:    Days per week: Not on file    Minutes per session: Not on file  . Stress: Not on file  Relationships  . Social connections:    Talks on phone: Not on file    Gets together: Not on file    Attends religious service: Not on file    Active member of club or organization: Not on file    Attends meetings of clubs or organizations: Not on file  Relationship status: Not on file  . Intimate partner violence:    Fear of current or ex partner: Not on file    Emotionally abused: Not on file    Physically abused: Not on file    Forced sexual activity: Not on file  Other Topics Concern  . Not on file  Social History Narrative   Caffeine: 2-3 cups coffee/day   Married 1975 and lives with husband   Occupation: Comptroller at [employer]   Activity: walks, hikes, gardening, wants to restart yoga   Diet: good water, fruits/vegetables daily, red meat 3-4 x/wk, fish 1x/wk     Constitutional: Denies fever, malaise, fatigue, headache or abrupt weight changes.  HEENT: Denies eye pain, eye redness, ear pain, ringing in the ears, wax buildup, runny nose, nasal congestion, bloody nose, or sore throat. Respiratory: Denies difficulty breathing, shortness of breath, cough or sputum production.   Cardiovascular: Denies chest pain, chest tightness, palpitations or swelling in the hands or feet.    Gastrointestinal: Denies abdominal pain, bloating, constipation, diarrhea or blood in the stool.  GU: Denies urgency, frequency, pain with urination, burning sensation, blood in urine, odor or discharge. Musculoskeletal: Denies decrease in range of motion, difficulty with gait, muscle pain or joint pain and swelling.  Skin: Denies redness, rashes, lesions or ulcercations.  Neurological: Denies dizziness, difficulty with memory, difficulty with speech or problems with balance and coordination.  Psych: Denies anxiety, depression, SI/HI.  No other specific complaints in a complete review of systems (except as listed in HPI above).     Objective:   Physical Exam   BP 126/78   Pulse 74   Temp 98.3 F (36.8 C) (Oral)   Ht 5\' 5"  (1.651 m)   Wt 159 lb (72.1 kg)   SpO2 98%   BMI 26.46 kg/m  Wt Readings from Last 3 Encounters:  01/14/18 159 lb (72.1 kg)  01/09/17 169 lb (76.7 kg)  11/17/16 170 lb (77.1 kg)    General: Appears her stated age, well developed, well nourished in NAD. Skin: Warm, dry and intact. Large bruise of left upper arm noted. HEENT: Head: normal shape and size; Eyes: sclera white, no icterus, conjunctiva pink, PERRLA and EOMs intact; Ears: Tm's gray and intact, normal light reflex;Throat/Mouth: Teeth present, mucosa pink and moist, no exudate, lesions or ulcerations noted.  Neck:  Neck supple, trachea midline. No masses, lumps or thyromegaly present.  Cardiovascular: Normal rate and rhythm. S1,S2 noted.  No murmur, rubs or gallops noted. No JVD or BLE edema. No carotid bruits noted. Pulmonary/Chest: Normal effort and positive vesicular breath sounds. No respiratory distress. No wheezes, rales or ronchi noted.  Abdomen: Soft and nontender. Normal bowel sounds. No distention or masses noted. Liver, spleen and kidneys non palpable. Musculoskeletal: Strength 5/5 BUE/BLE. No difficulty with gait.  Neurological: Alert and oriented. Cranial nerves II-XII grossly intact.  Coordination normal.  Psychiatric: Mood and affect normal. Behavior is normal. Judgment and thought content normal.    BMET    Component Value Date/Time   NA 140 11/17/2016 1228   K 4.4 11/17/2016 1228   CL 104 11/17/2016 1228   CO2 29 11/17/2016 1228   GLUCOSE 102 (H) 11/17/2016 1228   BUN 16 11/17/2016 1228   CREATININE 0.82 11/17/2016 1228   CALCIUM 9.6 11/17/2016 1228   GFRNONAA 78.62 01/15/2010 0841   GFRAA 84 01/21/2007 0915    Lipid Panel     Component Value Date/Time   CHOL 203 (H) 11/17/2016 1228  TRIG 94.0 11/17/2016 1228   HDL 59.00 11/17/2016 1228   CHOLHDL 3 11/17/2016 1228   VLDL 18.8 11/17/2016 1228   LDLCALC 126 (H) 11/17/2016 1228    CBC    Component Value Date/Time   WBC 5.3 11/17/2016 1228   RBC 4.67 11/17/2016 1228   HGB 14.0 11/17/2016 1228   HCT 42.7 11/17/2016 1228   PLT 221.0 11/17/2016 1228   MCV 91.4 11/17/2016 1228   MCHC 32.9 11/17/2016 1228   RDW 13.7 11/17/2016 1228    Hgb A1C Lab Results  Component Value Date   HGBA1C 5.9 11/17/2016           Assessment & Plan:   Preventative Health Maintenance:  Flu shot today Tetanus UTD Pap smear, mammogram and colon screening UTD Encouraged her to consume a balanced diet and exercise regimen Advised her to see an eye doctor and dentist annually Will check CBC, CMET, Lipid and Vit D today  RTC in 1 year, sooner if needed Nicki Reaper, NP

## 2018-01-14 NOTE — Assessment & Plan Note (Signed)
cmet and lipid profile today Encouraged her to consume a low fat diet 

## 2018-01-14 NOTE — Patient Instructions (Signed)
Health Maintenance for Postmenopausal Women Menopause is a normal process in which your reproductive ability comes to an end. This process happens gradually over a span of months to years, usually between the ages of 22 and 9. Menopause is complete when you have missed 12 consecutive menstrual periods. It is important to talk with your health care provider about some of the most common conditions that affect postmenopausal women, such as heart disease, cancer, and bone loss (osteoporosis). Adopting a healthy lifestyle and getting preventive care can help to promote your health and wellness. Those actions can also lower your chances of developing some of these common conditions. What should I know about menopause? During menopause, you may experience a number of symptoms, such as:  Moderate-to-severe hot flashes.  Night sweats.  Decrease in sex drive.  Mood swings.  Headaches.  Tiredness.  Irritability.  Memory problems.  Insomnia.  Choosing to treat or not to treat menopausal changes is an individual decision that you make with your health care provider. What should I know about hormone replacement therapy and supplements? Hormone therapy products are effective for treating symptoms that are associated with menopause, such as hot flashes and night sweats. Hormone replacement carries certain risks, especially as you become older. If you are thinking about using estrogen or estrogen with progestin treatments, discuss the benefits and risks with your health care provider. What should I know about heart disease and stroke? Heart disease, heart attack, and stroke become more likely as you age. This may be due, in part, to the hormonal changes that your body experiences during menopause. These can affect how your body processes dietary fats, triglycerides, and cholesterol. Heart attack and stroke are both medical emergencies. There are many things that you can do to help prevent heart disease  and stroke:  Have your blood pressure checked at least every 1-2 years. High blood pressure causes heart disease and increases the risk of stroke.  If you are 53-22 years old, ask your health care provider if you should take aspirin to prevent a heart attack or a stroke.  Do not use any tobacco products, including cigarettes, chewing tobacco, or electronic cigarettes. If you need help quitting, ask your health care provider.  It is important to eat a healthy diet and maintain a healthy weight. ? Be sure to include plenty of vegetables, fruits, low-fat dairy products, and lean protein. ? Avoid eating foods that are high in solid fats, added sugars, or salt (sodium).  Get regular exercise. This is one of the most important things that you can do for your health. ? Try to exercise for at least 150 minutes each week. The type of exercise that you do should increase your heart rate and make you sweat. This is known as moderate-intensity exercise. ? Try to do strengthening exercises at least twice each week. Do these in addition to the moderate-intensity exercise.  Know your numbers.Ask your health care provider to check your cholesterol and your blood glucose. Continue to have your blood tested as directed by your health care provider.  What should I know about cancer screening? There are several types of cancer. Take the following steps to reduce your risk and to catch any cancer development as early as possible. Breast Cancer  Practice breast self-awareness. ? This means understanding how your breasts normally appear and feel. ? It also means doing regular breast self-exams. Let your health care provider know about any changes, no matter how small.  If you are 40  or older, have a clinician do a breast exam (clinical breast exam or CBE) every year. Depending on your age, family history, and medical history, it may be recommended that you also have a yearly breast X-ray (mammogram).  If you  have a family history of breast cancer, talk with your health care provider about genetic screening.  If you are at high risk for breast cancer, talk with your health care provider about having an MRI and a mammogram every year.  Breast cancer (BRCA) gene test is recommended for women who have family members with BRCA-related cancers. Results of the assessment will determine the need for genetic counseling and BRCA1 and for BRCA2 testing. BRCA-related cancers include these types: ? Breast. This occurs in males or females. ? Ovarian. ? Tubal. This may also be called fallopian tube cancer. ? Cancer of the abdominal or pelvic lining (peritoneal cancer). ? Prostate. ? Pancreatic.  Cervical, Uterine, and Ovarian Cancer Your health care provider may recommend that you be screened regularly for cancer of the pelvic organs. These include your ovaries, uterus, and vagina. This screening involves a pelvic exam, which includes checking for microscopic changes to the surface of your cervix (Pap test).  For women ages 21-65, health care providers may recommend a pelvic exam and a Pap test every three years. For women ages 79-65, they may recommend the Pap test and pelvic exam, combined with testing for human papilloma virus (HPV), every five years. Some types of HPV increase your risk of cervical cancer. Testing for HPV may also be done on women of any age who have unclear Pap test results.  Other health care providers may not recommend any screening for nonpregnant women who are considered low risk for pelvic cancer and have no symptoms. Ask your health care provider if a screening pelvic exam is right for you.  If you have had past treatment for cervical cancer or a condition that could lead to cancer, you need Pap tests and screening for cancer for at least 20 years after your treatment. If Pap tests have been discontinued for you, your risk factors (such as having a new sexual partner) need to be  reassessed to determine if you should start having screenings again. Some women have medical problems that increase the chance of getting cervical cancer. In these cases, your health care provider may recommend that you have screening and Pap tests more often.  If you have a family history of uterine cancer or ovarian cancer, talk with your health care provider about genetic screening.  If you have vaginal bleeding after reaching menopause, tell your health care provider.  There are currently no reliable tests available to screen for ovarian cancer.  Lung Cancer Lung cancer screening is recommended for adults 69-62 years old who are at high risk for lung cancer because of a history of smoking. A yearly low-dose CT scan of the lungs is recommended if you:  Currently smoke.  Have a history of at least 30 pack-years of smoking and you currently smoke or have quit within the past 15 years. A pack-year is smoking an average of one pack of cigarettes per day for one year.  Yearly screening should:  Continue until it has been 15 years since you quit.  Stop if you develop a health problem that would prevent you from having lung cancer treatment.  Colorectal Cancer  This type of cancer can be detected and can often be prevented.  Routine colorectal cancer screening usually begins at  age 42 and continues through age 45.  If you have risk factors for colon cancer, your health care provider may recommend that you be screened at an earlier age.  If you have a family history of colorectal cancer, talk with your health care provider about genetic screening.  Your health care provider may also recommend using home test kits to check for hidden blood in your stool.  A small camera at the end of a tube can be used to examine your colon directly (sigmoidoscopy or colonoscopy). This is done to check for the earliest forms of colorectal cancer.  Direct examination of the colon should be repeated every  5-10 years until age 71. However, if early forms of precancerous polyps or small growths are found or if you have a family history or genetic risk for colorectal cancer, you may need to be screened more often.  Skin Cancer  Check your skin from head to toe regularly.  Monitor any moles. Be sure to tell your health care provider: ? About any new moles or changes in moles, especially if there is a change in a mole's shape or color. ? If you have a mole that is larger than the size of a pencil eraser.  If any of your family members has a history of skin cancer, especially at a young age, talk with your health care provider about genetic screening.  Always use sunscreen. Apply sunscreen liberally and repeatedly throughout the day.  Whenever you are outside, protect yourself by wearing long sleeves, pants, a wide-brimmed hat, and sunglasses.  What should I know about osteoporosis? Osteoporosis is a condition in which bone destruction happens more quickly than new bone creation. After menopause, you may be at an increased risk for osteoporosis. To help prevent osteoporosis or the bone fractures that can happen because of osteoporosis, the following is recommended:  If you are 46-71 years old, get at least 1,000 mg of calcium and at least 600 mg of vitamin D per day.  If you are older than age 55 but younger than age 65, get at least 1,200 mg of calcium and at least 600 mg of vitamin D per day.  If you are older than age 54, get at least 1,200 mg of calcium and at least 800 mg of vitamin D per day.  Smoking and excessive alcohol intake increase the risk of osteoporosis. Eat foods that are rich in calcium and vitamin D, and do weight-bearing exercises several times each week as directed by your health care provider. What should I know about how menopause affects my mental health? Depression may occur at any age, but it is more common as you become older. Common symptoms of depression  include:  Low or sad mood.  Changes in sleep patterns.  Changes in appetite or eating patterns.  Feeling an overall lack of motivation or enjoyment of activities that you previously enjoyed.  Frequent crying spells.  Talk with your health care provider if you think that you are experiencing depression. What should I know about immunizations? It is important that you get and maintain your immunizations. These include:  Tetanus, diphtheria, and pertussis (Tdap) booster vaccine.  Influenza every year before the flu season begins.  Pneumonia vaccine.  Shingles vaccine.  Your health care provider may also recommend other immunizations. This information is not intended to replace advice given to you by your health care provider. Make sure you discuss any questions you have with your health care provider. Document Released: 05/30/2005  Document Revised: 10/26/2015 Document Reviewed: 01/09/2015 Elsevier Interactive Patient Education  2018 [employer] Inc[employer]  

## 2018-02-11 ENCOUNTER — Ambulatory Visit: Payer: Self-pay

## 2018-02-11 ENCOUNTER — Encounter (HOSPITAL_COMMUNITY): Payer: Self-pay

## 2018-02-11 ENCOUNTER — Emergency Department (HOSPITAL_COMMUNITY): Payer: BC Managed Care – PPO

## 2018-02-11 ENCOUNTER — Emergency Department (HOSPITAL_COMMUNITY)
Admission: EM | Admit: 2018-02-11 | Discharge: 2018-02-11 | Disposition: A | Discharge status: Home / Self Care | Payer: BC Managed Care – PPO | Attending: Emergency Medicine | Admitting: Emergency Medicine

## 2018-02-11 ENCOUNTER — Other Ambulatory Visit: Payer: Self-pay

## 2018-02-11 DIAGNOSIS — S7001XA Contusion of right hip, initial encounter: Secondary | ICD-10-CM | POA: Insufficient documentation

## 2018-02-11 DIAGNOSIS — Y92009 Unspecified place in unspecified non-institutional (private) residence as the place of occurrence of the external cause: Secondary | ICD-10-CM | POA: Insufficient documentation

## 2018-02-11 DIAGNOSIS — Z79899 Other long term (current) drug therapy: Secondary | ICD-10-CM | POA: Diagnosis not present

## 2018-02-11 DIAGNOSIS — Z23 Encounter for immunization: Secondary | ICD-10-CM | POA: Diagnosis not present

## 2018-02-11 DIAGNOSIS — Y939 Activity, unspecified: Secondary | ICD-10-CM | POA: Insufficient documentation

## 2018-02-11 DIAGNOSIS — Y999 Unspecified external cause status: Secondary | ICD-10-CM | POA: Insufficient documentation

## 2018-02-11 DIAGNOSIS — R51 Headache: Secondary | ICD-10-CM | POA: Insufficient documentation

## 2018-02-11 DIAGNOSIS — S1093XA Contusion of unspecified part of neck, initial encounter: Secondary | ICD-10-CM | POA: Insufficient documentation

## 2018-02-11 DIAGNOSIS — S0990XA Unspecified injury of head, initial encounter: Secondary | ICD-10-CM | POA: Insufficient documentation

## 2018-02-11 DIAGNOSIS — S0003XA Contusion of scalp, initial encounter: Secondary | ICD-10-CM | POA: Insufficient documentation

## 2018-02-11 DIAGNOSIS — W1830XA Fall on same level, unspecified, initial encounter: Secondary | ICD-10-CM | POA: Insufficient documentation

## 2018-02-11 MED ORDER — TETANUS-DIPHTH-ACELL PERTUSSIS 5-2.5-18.5 LF-MCG/0.5 IM SUSP
0.5000 mL | Freq: Once | INTRAMUSCULAR | Status: AC
  Administered 2018-02-11: 0.5 mL via INTRAMUSCULAR
  Filled 2018-02-11: qty 0.5

## 2018-02-11 NOTE — Telephone Encounter (Signed)
Pt called with to report a fall Friday night late or Saturday morning early.She states she was going up stairs and passed out and fell back and hit her head on the corner of an cabinet.  She reports a large swollen area to the back of her head.  No bleeding or open area. She is alert and oriented. She is visiting in [place]. She had a headache but it has resolved. She denies nausea. She has a sore back and a bruised area from the fall on her back. No pain except when she tries to lay on the area. She states she just doesn't feel good.  Per protocol pt will go to ED for evaluation. [facility] was contacted per protocol. Care advice read to patient.  Pt verbalized understanding of all instructions. Reason for Disposition . [1] Knocked out (unconscious) < 1 minute AND [2] now fine  Answer Assessment - Initial Assessment Questions 1. MECHANISM: "How did the injury happen?" For falls, ask: "What height did you fall from?" and "What surface did you fall against?"      Up stairs fell and hit head passed out going up 2. ONSET: "When did the injury happen?" (Minutes or hours ago)      Friday night late to Saturday morning 3. NEUROLOGIC SYMPTOMS: "Was there any loss of consciousness?" "Are there any other neurological symptoms?"      After and before hitting head 4. MENTAL STATUS: "Does the person know who he is, who you are, and where he is?"      yes 5. LOCATION: "What part of the head was hit?"      back 6. SCALP APPEARANCE: "What does the scalp look like? Is it bleeding now?" If so, ask: "Is it difficult to stop?"      Palm of hand 7. SIZE: For cuts, bruises, or swelling, ask: "How large is it?" (e.g., inches or centimeters)      no 8. PAIN: "Is there any pain?" If so, ask: "How bad is it?"  (e.g., Scale 1-10; or mild, moderate, severe)     headache gone bruise is painful 9. TETANUS: For any breaks in the skin, ask: "When was the last tetanus booster?"     unknown 10. OTHER SYMPTOMS: "Do you have  any other symptoms?" (e.g., neck pain, vomiting)       No back is sore 11. PREGNANCY: "Is there any chance you are pregnant?" "When was your last menstrual period?"       N/A  Protocols used: HEAD INJURY-A-AH

## 2018-02-11 NOTE — Discharge Instructions (Addendum)
Take Tylenol for pain and follow-up with your doctor if needed °

## 2018-02-11 NOTE — Telephone Encounter (Signed)
Agree with ER.

## 2018-02-11 NOTE — ED Provider Notes (Signed)
[facility]-EMERGENCY DEPT Provider Note   CSN: [id number] Arrival date & time: 02/11/18  1055     History   Chief Complaint Chief Complaint  Patient presents with  . Fall  . Head Injury    HPI Cheryl Melendez is a 67 y.o. female.  Patient fell couple days ago hit her head.  She complains of a headache.  Patient states that she has lost consciousness for less than a minute  The history is provided by the patient.  Fall  This is a new problem. The current episode started yesterday. The problem occurs rarely. The problem has been resolved. Associated symptoms include headaches. Pertinent negatives include no chest pain and no abdominal pain. Nothing aggravates the symptoms. Nothing relieves the symptoms. She has tried nothing for the symptoms. The treatment provided no relief.    Past Medical History:  Diagnosis Date  . HLD (hyperlipidemia)     Patient Active Problem List   Diagnosis Date Noted  . HYPERCHOLESTEROLEMIA 12/30/2006    Past Surgical History:  Procedure Laterality Date  . CESAREAN SECTION     x 1  . CYSTECTOMY  1990's   L branchial cleft  . excision of mucoid cyst  12/16/2006   left thumb  . HAND SURGERY Right 05/2014   R finger PIP joint dislocation s/p pinning [facility])  . MRI back  2004   L4/5 ruptured disc with radiculopathy  . VAGINAL BIRTH AFTER CESAREAN SECTION     x 1     OB History   None      Home Medications    Prior to Admission medications   Medication Sig Start Date End Date Taking? Authorizing Provider  acetaminophen (TYLENOL) 500 MG tablet Take 1,000 mg by mouth every 6 (six) hours as needed for moderate pain.   Yes [provider]  Calcium Carb-Cholecalciferol (CALCIUM 500+D PO) Take 1 tablet by mouth daily.   Yes [provider]  COD LIVER OIL PO Take 1 capsule by mouth every other day. Alternates with fish oil   Yes [provider]  ibuprofen (ADVIL,MOTRIN) 200 MG tablet Take  600 mg by mouth every 6 (six) hours as needed for mild pain.   Yes [provider]  Multiple Vitamin (MULTIVITAMIN) tablet Take 1 tablet by mouth daily.     Yes [provider]  Omega-3 Fatty Acids (FISH OIL) 1000 MG CAPS Take 1 capsule by mouth every other day. Alternates with cod liver oil   Yes [provider]  vitamin B-12 (CYANOCOBALAMIN) 1000 MCG tablet Take 1 tablet (1,000 mcg total) by mouth daily. 01/10/17  Yes Eustaquio Boyden, MD  zinc gluconate 50 MG tablet Take 50 mg by mouth daily.   Yes [provider]  EPINEPHrine (EPIPEN) 0.3 mg/0.3 mL DEVI Inject 0.3 mLs (0.3 mg total) into the muscle once. As directed 12/10/11   Eustaquio Boyden, MD    Family History Family History  Problem Relation Age of Onset  . Hypertension Mother   . Alzheimer's disease Mother   . Diabetes Father   . Coronary artery disease Father 29       CABG x 4  . Hypertension Brother   . Alcohol abuse Brother   . Glaucoma Brother   . Arthritis Brother   . Obesity Brother        knee  . Cancer Neg Hx     Social History Social History   Tobacco Use  . Smoking status: Never Smoker  .  Smokeless tobacco: Never Used  Substance Use Topics  . Alcohol use: Yes    Comment: occ glass of wine  . Drug use: No     Allergies   Bee venom and Sulfonamide derivatives   Review of Systems Review of Systems  Constitutional: Negative for appetite change and fatigue.  HENT: Negative for congestion, ear discharge and sinus pressure.   Eyes: Negative for discharge.  Respiratory: Negative for cough.   Cardiovascular: Negative for chest pain.  Gastrointestinal: Negative for abdominal pain and diarrhea.  Genitourinary: Negative for frequency and hematuria.  Musculoskeletal: Negative for back pain.  Skin: Negative for rash.  Neurological: Positive for headaches. Negative for seizures.  Psychiatric/Behavioral: Negative for hallucinations.     Physical Exam Updated Vital  Signs BP (!) 121/53   Pulse 73   Temp 98.2 F (36.8 C) (Oral)   Resp 18   Ht 5\' 5"  (1.651 m)   Wt 68 kg   SpO2 98%   BMI 24.96 kg/m   Physical Exam  Constitutional: She is oriented to person, place, and time. She appears well-developed.  HENT:  Head: Normocephalic.  Swelling to occipital area  Eyes: Conjunctivae and EOM are normal. No scleral icterus.  Neck: Neck supple. No thyromegaly present.  Cardiovascular: Normal rate and regular rhythm. Exam reveals no gallop and no friction rub.  No murmur heard. Pulmonary/Chest: No stridor. She has no wheezes. She has no rales. She exhibits no tenderness.  Abdominal: She exhibits no distension. There is no tenderness. There is no rebound.  Musculoskeletal: Normal range of motion. She exhibits no edema.  Lymphadenopathy:    She has no cervical adenopathy.  Neurological: She is oriented to person, place, and time. She exhibits normal muscle tone. Coordination normal.  Skin: No rash noted. No erythema.  Psychiatric: She has a normal mood and affect. Her behavior is normal.     ED Treatments / Results  Labs (all labs ordered are listed, but only abnormal results are displayed) Labs Reviewed - No data to display  EKG None  Radiology Ct Head Wo Contrast  Result Date: 02/11/2018 CLINICAL DATA:  "passed out," Patient did hit the back of her head. patient has a large hematoma to the back of the head and bruising to the right hip and bruising to the neck. EXAM: CT HEAD WITHOUT CONTRAST TECHNIQUE: Contiguous axial images were obtained from the base of the skull through the vertex without intravenous contrast. COMPARISON:  None. FINDINGS: Brain: No evidence of acute infarction, hemorrhage, hydrocephalus, extra-axial collection or mass lesion/mass effect. Vascular: No hyperdense vessel or unexpected calcification. Skull: Normal. Negative for fracture or focal lesion. Sinuses/Orbits: Hypoplastic frontal sinuses.  Otherwise negative. Other: Large  left occipital scalp hematoma. IMPRESSION: Negative for bleed or other acute intracranial process. Electronically Signed   By: Corlis Leak M.D.   On: 02/11/2018 12:56    Procedures Procedures (including critical care time)  Medications Ordered in ED Medications  Tdap (BOOSTRIX) injection 0.5 mL (has no administration in time range)     Initial Impression / Assessment and Plan / ED Course  I have reviewed the triage vital signs and the nursing notes.  Pertinent labs & imaging results that were available during my care of the patient were reviewed by me and considered in my medical decision making (see chart for details).   CT of the head unremarkable.  She will follow-up with her doctor as needed  Final Clinical Impressions(s) / ED Diagnoses   Final diagnoses:  Injury  of head, initial encounter    ED Discharge Orders    None       Bethann Berkshire, MD 02/13/18 2205

## 2018-02-11 NOTE — ED Triage Notes (Signed)
Patient fell asleep on the couch 5 days ago and when she got up she states when she was going up the stairs and "passed out," Patient did hit the back of her head.   patient has a large hematoma to the back of the head and bruising to the right hip and bruising to the neck.

## 2018-02-22 ENCOUNTER — Telehealth: Payer: Self-pay | Admitting: Internal Medicine

## 2018-02-22 NOTE — Telephone Encounter (Signed)
Best number [phone] Pt called stating you set her up with neurology referral last year and pt didn't go to appointment she wanted to if you would give her another referral.  Pt stated she is having issues, pt fell hit her head she went to er [facility] 1 week or so ago.    Pt didn't want to make a follow up appointment with you she wanted referral.  I let pt know she may have to come in before getting referral.

## 2018-02-22 NOTE — Telephone Encounter (Signed)
She was referred by Dr. Reece Agar for cerebellar ataxia, didn't go to her appt. When I saw her 12/2017, she reports she was doing much better. She will need 30 min appt for ER follow up and to discuss ongoing gait issues and for referral.

## 2018-02-23 NOTE — Telephone Encounter (Signed)
Pt has appt scheduled for 03/01/18

## 2018-02-25 ENCOUNTER — Telehealth: Payer: Self-pay

## 2018-02-25 NOTE — Telephone Encounter (Signed)
Agree with advice given

## 2018-02-25 NOTE — Telephone Encounter (Signed)
Pt called; pt dropped glass this morning and walking different than pts normal. More dizzy than usual. EMS at pts home; Inetta Fermo with EMS thinks more related to parkinsons or nerves than a stroke; BP 150/86; P 102 BS 113 and pulse ox 97%. No available appts at [facility]; spoke with [facility] and advised for pt to go to ED for eval. Inetta Fermo voiced understanding. FYI to Pamala Hurry NP.

## 2018-03-01 ENCOUNTER — Encounter: Payer: Self-pay | Admitting: Internal Medicine

## 2018-03-01 ENCOUNTER — Ambulatory Visit (INDEPENDENT_AMBULATORY_CARE_PROVIDER_SITE_OTHER): Payer: Medicare Other | Admitting: Internal Medicine

## 2018-03-01 VITALS — BP 124/80 | HR 82 | Temp 98.7°F | Wt 158.0 lb

## 2018-03-01 DIAGNOSIS — G119 Hereditary ataxia, unspecified: Secondary | ICD-10-CM

## 2018-03-01 DIAGNOSIS — R296 Repeated falls: Secondary | ICD-10-CM | POA: Diagnosis not present

## 2018-03-01 DIAGNOSIS — S0990XD Unspecified injury of head, subsequent encounter: Secondary | ICD-10-CM | POA: Diagnosis not present

## 2018-03-01 NOTE — Progress Notes (Signed)
Subjective:    Patient ID: Cheryl Melendez, female    DOB: 1950-07-15, 67 y.o.   MRN: [id number]  HPI  Pt presents to the clinic today for ER follow up. She reports she was going up her stairs on 10/21 after having some CBD oil and 1 beer. She reports she felt like she was going to pass out, but fell backwards. She reports she hit her head on corner of cupboard. She did but some ice on it but did not seek care immediately. She eventually went to the ER on 10/24. CT scan of the head was negative. She reports multiple other falls this year without injury. She is having problems with balance, coordination and gait. She was seen by Dr. Sharen Hones 9/21 for the same. He offered her referral to neurology for further evaluation of cerebellar ataxia, but she never went to the appointment. When I saw her a few months ago, she felt like her balance issues had improved but given her latest fall, she thinks she should see neurology.   Review of Systems  Past Medical History:  Diagnosis Date  . HLD (hyperlipidemia)     Current Outpatient Medications  Medication Sig Dispense Refill  . acetaminophen (TYLENOL) 500 MG tablet Take 1,000 mg by mouth every 6 (six) hours as needed for moderate pain.    . Calcium Carb-Cholecalciferol (CALCIUM 500+D PO) Take 1 tablet by mouth daily.    . COD LIVER OIL PO Take 1 capsule by mouth every other day. Alternates with fish oil    . EPINEPHrine (EPIPEN) 0.3 mg/0.3 mL DEVI Inject 0.3 mLs (0.3 mg total) into the muscle once. As directed 1 Device 1  . ibuprofen (ADVIL,MOTRIN) 200 MG tablet Take 600 mg by mouth every 6 (six) hours as needed for mild pain.    . Multiple Vitamin (MULTIVITAMIN) tablet Take 1 tablet by mouth daily.      . Omega-3 Fatty Acids (FISH OIL) 1000 MG CAPS Take 1 capsule by mouth every other day. Alternates with cod liver oil    . vitamin B-12 (CYANOCOBALAMIN) 1000 MCG tablet Take 1 tablet (1,000 mcg total) by mouth daily.    Marland Kitchen zinc gluconate 50 MG tablet  Take 50 mg by mouth daily.     No current facility-administered medications for this visit.     Allergies  Allergen Reactions  . Bee Venom Swelling  . Sulfonamide Derivatives     REACTION: rash    Family History  Problem Relation Age of Onset  . Hypertension Mother   . Alzheimer's disease Mother   . Diabetes Father   . Coronary artery disease Father 34       CABG x 4  . Hypertension Brother   . Alcohol abuse Brother   . Glaucoma Brother   . Arthritis Brother   . Obesity Brother        knee  . Cancer Neg Hx     Social History   Socioeconomic History  . Marital status: Married    Spouse name: Not on file  . Number of children: 2  . Years of education: Not on file  . Highest education level: Not on file  Occupational History  . Occupation: Comptroller    Comment: Designer, multimedia  Social Needs  . Financial resource strain: Not on file  . Food insecurity:    Worry: Not on file    Inability: Not on file  . Transportation needs:    Medical: Not on file  Non-medical: Not on file  Tobacco Use  . Smoking status: Never Smoker  . Smokeless tobacco: Never Used  Substance and Sexual Activity  . Alcohol use: Yes    Comment: occ glass of wine  . Drug use: No  . Sexual activity: Yes  Lifestyle  . Physical activity:    Days per week: Not on file    Minutes per session: Not on file  . Stress: Not on file  Relationships  . Social connections:    Talks on phone: Not on file    Gets together: Not on file    Attends religious service: Not on file    Active member of club or organization: Not on file    Attends meetings of clubs or organizations: Not on file    Relationship status: Not on file  . Intimate partner violence:    Fear of current or ex partner: Not on file    Emotionally abused: Not on file    Physically abused: Not on file    Forced sexual activity: Not on file  Other Topics Concern  . Not on file  Social History Narrative   Caffeine:  2-3 cups coffee/day   Married 1975 and lives with husband   Occupation: Comptroller at [employer]   Activity: walks, hikes, gardening, wants to restart yoga   Diet: good water, fruits/vegetables daily, red meat 3-4 x/wk, fish 1x/wk     Constitutional: Denies fever, malaise, fatigue, headache or abrupt weight changes.  Respiratory: Denies difficulty breathing, shortness of breath, cough or sputum production.   Cardiovascular: Denies chest pain, chest tightness, palpitations or swelling in the hands or feet.  Musculoskeletal: Pt reports multiple falls. Denies decrease in range of motion, difficulty with gait, muscle pain or joint pain and swelling.  Neurological: Pt reports problems with balance, coordination. Denies dizziness, difficulty with memory, difficulty with speech.    No other specific complaints in a complete review of systems (except as listed in HPI above).    Objective:   Physical Exam   BP 124/80   Pulse 82   Temp 98.7 F (37.1 C) (Oral)   Wt 158 lb (71.7 kg)   SpO2 98%   BMI 26.29 kg/m  Wt Readings from Last 3 Encounters:  03/01/18 158 lb (71.7 kg)  02/11/18 150 lb (68 kg)  01/14/18 159 lb (72.1 kg)    General: Appears her stated age, well developed, well nourished in NAD. Skin: Warm, dry and intact. No brusing, abrasions or lacerations noted. HEENT: Head: normal shape and size; Eyes: sclera white, no icterus, conjunctiva pink, PERRLA and EOMs intact;  Cardiovascular: Normal rate and rhythm. S1,S2 noted.  No murmur, rubs or gallops noted.  Pulmonary/Chest: Normal effort and positive vesicular breath sounds. No respiratory distress. No wheezes, rales or ronchi noted.  Musculoskeletal: Wide gait. She is unable to perform tandem walking. She is unsteady walking on tip toes and heels. Neurological: Alert and oriented. Negative [place]. Coordination normal.    BMET    Component Value Date/Time   NA 139 01/14/2018 1200   K 4.2 01/14/2018 1200   CL 103 01/14/2018  1200   CO2 29 01/14/2018 1200   GLUCOSE 98 01/14/2018 1200   BUN 16 01/14/2018 1200   CREATININE 0.84 01/14/2018 1200   CALCIUM 9.8 01/14/2018 1200   GFRNONAA 78.62 01/15/2010 0841   GFRAA 84 01/21/2007 0915    Lipid Panel     Component Value Date/Time   CHOL 199 01/14/2018 1200   TRIG  162.0 (H) 01/14/2018 1200   HDL 63.10 01/14/2018 1200   CHOLHDL 3 01/14/2018 1200   VLDL 32.4 01/14/2018 1200   LDLCALC 103 (H) 01/14/2018 1200    CBC    Component Value Date/Time   WBC 5.3 01/14/2018 1200   RBC 4.75 01/14/2018 1200   HGB 14.1 01/14/2018 1200   HCT 42.0 01/14/2018 1200   PLT 231.0 01/14/2018 1200   MCV 88.5 01/14/2018 1200   MCHC 33.6 01/14/2018 1200   RDW 14.0 01/14/2018 1200    Hgb A1C Lab Results  Component Value Date   HGBA1C 5.9 11/17/2016           Assessment & Plan:   ER Follow up for Fall, Head Injury, Cerebellar Ataxia:  ER notes, labs and imaging reviewed No injuries noted Will refer to neurology for further evaluation at this time Advised her to avoid ETOH, CBD oil at this time  Return precautions discussed Nicki Reaper, NP

## 2018-03-02 ENCOUNTER — Telehealth: Payer: Self-pay | Admitting: Internal Medicine

## 2018-03-02 NOTE — Telephone Encounter (Signed)
Pt would like a referral for ENT due to her dizziness she stated she spoke to you about yesterday. Please advise pt.

## 2018-03-02 NOTE — Telephone Encounter (Signed)
She didn't mention anything about dizziness. We are going to have her evaluated by neurology first and see what they say.

## 2018-03-03 ENCOUNTER — Telehealth: Payer: Self-pay | Admitting: Internal Medicine

## 2018-03-03 NOTE — Telephone Encounter (Signed)
Pt called office requesting to be referred to ENT Dr. Narda Bondshris Newman.

## 2018-03-03 NOTE — Telephone Encounter (Signed)
I want her seen by neurology first.

## 2018-03-04 ENCOUNTER — Encounter: Payer: Self-pay | Admitting: Internal Medicine

## 2018-03-04 NOTE — Telephone Encounter (Signed)
Pt is aware as instructed 

## 2018-03-04 NOTE — Patient Instructions (Signed)
Ataxia °Ataxia is a condition that results in unsteadiness when walking and standing, poor coordination of body movements, and difficulty maintaining an upright posture. It occurs due to a problem with the part of your brain that controls coordination and stability (cerebellar dysfunction). °What are the causes? °Ataxia can develop later in life (acquired ataxia) during your 20s to 30s, and even as late as into your 60s or beyond. Acquired ataxia may be caused by: °· Changes in your nervous system (neurodegenerative). °· Changes throughout your body (systemic disorders). °· Excess exposure to: °? Medicines, such as phenytoin and lithium. °? Solvents. °? Abuse of alcohol (alcoholism). °· Medical conditions, such as: °? Celiac sprue. °? Hypothyroidism. °? Vitamin E deficiency. °? Structural brain abnormalities, such as tumors. °? Multiple sclerosis. °? Stroke. °? Head injury. ° °Ataxia may also be present early in life (non-acquired ataxia). There are two main types of non-acquired ataxia: °· Cerebellar dysfunction present at birth (congenital). °· Family inheritance (genetic heredity). Friedreich ataxia is the most common form of hereditary ataxia. ° °What are the signs or symptoms? °The signs and symptoms of ataxia can vary depending on how severe the condition is that causes it. Signs and symptoms may include: °· Unsteadiness. °· Walking with a wide stance. °· Tremor. °· Poorly coordinated body movements. °· Difficulty maintaining a straight (upright) posture. °· Fatigue. °· Changes in your speech. °· Changes in your vision. °· Difficulty swallowing. °· Difficulty with writing. °· Decreased mental status (dementia). °· Muscle spasms. ° °How is this diagnosed? °Ataxia is diagnosed by discussing your personal and family history and through a physical exam. You may also have additional tests such as: °· MRI. °· Genetic testing. ° °How is this treated? °Treatment for ataxia may include treating or removing the  underlying condition causing the ataxia. Surgery may be required if a structural abnormality in your brain is causing the ataxia. Otherwise, supportive treatments may be used to manage your symptoms. °Follow these instructions at home: °Monitor your ataxia for any changes. The following actions may help any discomfort you are experiencing: °· Do not drink alcohol. °· Lie down right away if you become very unsteady, dizzy, nauseated, or feel like you are going to faint. Wait until all of these feelings pass before you get up again. ° °Get help right away if: °· Your unsteadiness suddenly worsens. °· You develop severe headaches, chest pain, or abdominal pain. °· You have weakness or numbness on one side of your body. °· You have problems with your vision. °· You feel confused. °· You have difficulty speaking. °· You have an irregular heartbeat or a very fast pulse. °This information is not intended to replace advice given to you by your health care provider. Make sure you discuss any questions you have with your health care provider. °Document Released: 11/02/2013 Document Revised: 09/13/2015 Document Reviewed: 07/08/2013 °Elsevier Interactive Patient Education © 2018 [employer] ° °

## 2018-03-24 ENCOUNTER — Other Ambulatory Visit: Payer: Self-pay | Admitting: Internal Medicine

## 2018-03-24 ENCOUNTER — Telehealth: Payer: Self-pay | Admitting: Internal Medicine

## 2018-03-24 DIAGNOSIS — Z8611 Personal history of tuberculosis: Secondary | ICD-10-CM

## 2018-03-24 NOTE — Telephone Encounter (Signed)
Phone call from Dr Haroldine Lawsrossley Seeing her today Concerned---missionary and known positive TB Has not had treatment in past and he is concerned that she needs Rx No recent CXR---needs attention  Also going to Dr Lucia GaskinsAhern for cerebellar ataxia Meniere's some better with low salt diet

## 2018-03-24 NOTE — Telephone Encounter (Signed)
Called pt, no answer and VM full 

## 2018-03-24 NOTE — Telephone Encounter (Signed)
Call pt:  Have her come in lab only for chest xray for evaluation of active TB. I will place order.

## 2018-03-25 ENCOUNTER — Ambulatory Visit (INDEPENDENT_AMBULATORY_CARE_PROVIDER_SITE_OTHER)
Admission: RE | Admit: 2018-03-25 | Discharge: 2018-03-25 | Disposition: A | Discharge status: Home / Self Care | Payer: Medicare Other | Source: Ambulatory Visit | Attending: Internal Medicine | Admitting: Internal Medicine

## 2018-03-25 DIAGNOSIS — Z8611 Personal history of tuberculosis: Secondary | ICD-10-CM | POA: Diagnosis not present

## 2018-05-18 ENCOUNTER — Ambulatory Visit: Payer: BC Managed Care – PPO | Admitting: Neurology

## 2018-05-18 ENCOUNTER — Encounter: Payer: Self-pay | Admitting: Neurology

## 2018-05-18 ENCOUNTER — Telehealth: Payer: Self-pay | Admitting: Neurology

## 2018-05-18 VITALS — BP 151/75 | HR 101 | Ht 65.0 in | Wt 162.0 lb

## 2018-05-18 DIAGNOSIS — E538 Deficiency of other specified B group vitamins: Secondary | ICD-10-CM

## 2018-05-18 DIAGNOSIS — G259 Extrapyramidal and movement disorder, unspecified: Secondary | ICD-10-CM | POA: Diagnosis not present

## 2018-05-18 MED ORDER — ALPRAZOLAM 0.5 MG PO TABS: ORAL_TABLET | ORAL | 0 refills | Status: DC

## 2018-05-18 NOTE — Telephone Encounter (Signed)
[employer] medicare order sent to GI patient is aware

## 2018-05-18 NOTE — Progress Notes (Signed)
Reason for visit: Gait disorder  Referring physician: Dr. Raechel Melendez is a 67 y.o. female  History of present illness:  Cheryl Melendez is a 68 year old right-handed white female with a history of a gait disorder that has been present since around 2015.  The patient comes in today with her husband.  The patient has had some problems with being fidgety throughout her life, but this has gotten worse over the last several years.  Within the last several months, the patient has had some alteration in her speech, she has had some slight slurring.  She has fallen on several occasions recently, she fell down some stairs on 11 February 2018 and bumped her head, CT scan of the brain at that time was unremarkable.  The patient fell again on 13 April 2018.  The patient has had falls off and on throughout the years since 2015.  The patient was discovered to have a significant vitamin B12 deficiency in 2018, she is on oral supplementation at this time.  The patient began to feel much better when she went on the B12 supplementation.  The patient indicates that her mother in her 49s began having problems with being somewhat fidgety and falling a lot prior to her death at age 71.  No other family members are noted to have walking problems.  The patient has 2 children, neither child has difficulty with walking.  The patient reports some anxiety problems, possibly some depression.  She reports no numbness or weakness of the extremities, she denies issues controlling the bowels or the bladder.  She has not had any problems with memory or problems with swallowing.  She is sent to this office for an evaluation.  Past Medical History:  Diagnosis Date  . HLD (hyperlipidemia)   . Ruptured disk 2000    Past Surgical History:  Procedure Laterality Date  . CESAREAN SECTION     x 1  . CYSTECTOMY  1990's   L branchial cleft  . excision of mucoid cyst  12/16/2006   left thumb  . HAND SURGERY Right 05/2014   R  finger PIP joint dislocation s/p pinning [facility])  . MRI back  2004   L4/5 ruptured disc with radiculopathy  . VAGINAL BIRTH AFTER CESAREAN SECTION     x 1  . wart removal     right foot     Family History  Problem Relation Age of Onset  . Hypertension Mother        Passed at 50  . Alzheimer's disease Mother   . Stroke Mother   . Dementia Mother   . Diabetes Father        Passed at 18  . Coronary artery disease Father 6       CABG x 4  . Stroke Father   . Hypertension Brother   . Alcohol abuse Brother   . Glaucoma Brother   . Arthritis Brother   . Obesity Brother        knee  . Alcohol abuse Brother   . Cancer Neg Hx     Social history:  reports that she has never smoked. She has never used smokeless tobacco. She reports current alcohol use. She reports that she does not use drugs.  Medications:  Prior to Admission medications   Medication Sig Start Date End Date Taking? Authorizing Provider  acetaminophen (TYLENOL) 500 MG tablet Take 1,000 mg by mouth every 6 (six) hours as needed for moderate pain.  [provider]  Calcium Carb-Cholecalciferol (CALCIUM 500+D PO) Take 1 tablet by mouth daily.    [provider]  COD LIVER OIL PO Take 1 capsule by mouth every other day. Alternates with fish oil    [provider]  EPINEPHrine (EPIPEN) 0.3 mg/0.3 mL DEVI Inject 0.3 mLs (0.3 mg total) into the muscle once. As directed 12/10/11   Eustaquio Boyden, MD  ibuprofen (ADVIL,MOTRIN) 200 MG tablet Take 600 mg by mouth every 6 (six) hours as needed for mild pain.    [provider]  Multiple Vitamin (MULTIVITAMIN) tablet Take 1 tablet by mouth daily.      [provider]  Omega-3 Fatty Acids (FISH OIL) 1000 MG CAPS Take 1 capsule by mouth every other day. Alternates with cod liver oil    [provider]  vitamin B-12 (CYANOCOBALAMIN) 1000 MCG tablet Take 1 tablet (1,000 mcg total) by mouth daily. 01/10/17   Eustaquio Boyden,  MD  zinc gluconate 50 MG tablet Take 50 mg by mouth daily.    [provider]      Allergies  Allergen Reactions  . Bee Venom Swelling  . Sulfonamide Derivatives     REACTION: rash    ROS:  Out of a complete 14 system review of symptoms, the patient complains only of the following symptoms, and all other reviewed systems are negative.  Insomnia Passing out Depression, anxiety Hearing loss  Blood pressure (!) 151/75, pulse (!) 101, height 5\' 5"  (1.651 m), weight 162 lb (73.5 kg).  Physical Exam  General: The patient is alert and cooperative at the time of the examination.  Eyes: Pupils are equal, round, and reactive to light. Discs are flat bilaterally.  Neck: The neck is supple, no carotid bruits are noted.  Respiratory: The respiratory examination is clear.  Cardiovascular: The cardiovascular examination reveals a regular rate and rhythm, no obvious murmurs or rubs are noted.  Skin: Extremities are without significant edema.  Neurologic Exam  Mental status: The patient is alert and oriented x 3 at the time of the examination. The patient has apparent normal recent and remote memory, with an apparently normal attention span and concentration ability.  Cranial nerves: Facial symmetry is present. There is good sensation of the face to pinprick and soft touch bilaterally. The strength of the facial muscles and the muscles to head turning and shoulder shrug are normal bilaterally. Speech is well enunciated, no aphasia or dysarthria is noted. Extraocular movements are full. Visual fields are full. The tongue is midline, and the patient has symmetric elevation of the soft palate. No obvious hearing deficits are noted.  The patient has unusual movements of her face, occasional tongue protrusions.  Motor: The motor testing reveals 5 over 5 strength of all 4 extremities. Good symmetric motor tone is noted throughout.  Sensory: Sensory testing is intact to pinprick, soft  touch, vibration sensation, and position sense on all 4 extremities, with exception some decrease in position sense in both feet. No evidence of extinction is noted.  Coordination: Cerebellar testing reveals some ataxia with finger-nose-finger and heel shin bilaterally, the patient has unusual choreoathetotic movements with the arms and legs.  Gait and station: Gait is relatively stable, while walking, the patient has choreoathetotic movements of the hands, right greater than left.  Tandem gait is slightly unsteady.  Romberg is negative.  Reflexes: Deep tendon reflexes are symmetric and normal bilaterally. Toes are downgoing bilaterally.   Assessment/Plan:  1.  Gait disorder  2.  Movement disorder, choreoathetosis  The patient has had a gradually progressive gait disorder over the last several years.  The patient appears to have significant problems with choreoathetosis on clinical examination, the patient likely has a Huntington's disease process.  The patient will be sent for MRI evaluation of the brain, further blood work will be done.  We have discussed the possibility of genetic testing to confirm the diagnosis.  If the patient decides that she wishes to have this testing done, she will contact our office.  Otherwise, the patient will follow-up in 4 months.  In the future, we may add Haldol to help control the abnormal movements.  The patient has 2 children, neither is clinically affected at this point.  Marlan Palau. Keith Azure Budnick MD 05/18/2018 9:16 AM  [facility] [address] [place], [place] [id number]  Phone [phone] Fax [phone]

## 2018-05-20 LAB — ENA+DNA/DS+SJORGEN'S
ENA RNP Ab: <0.2 AI (ref 0.0–0.9)
ENA SM Ab Ser-aCnc: <0.2 AI (ref 0.0–0.9)
ENA SSA (RO) Ab: >8 AI — ABNORMAL HIGH (ref 0.0–0.9)
ENA SSB (LA) Ab: <0.2 AI (ref 0.0–0.9)
dsDNA Ab: <1 IU/mL (ref 0–9)

## 2018-05-20 LAB — COPPER, SERUM: COPPER: 114 ug/dL (ref 72–166)

## 2018-05-20 LAB — HIV ANTIBODY (ROUTINE TESTING W REFLEX): HIV Screen 4th Generation wRfx: NONREACTIVE

## 2018-05-20 LAB — ANGIOTENSIN CONVERTING ENZYME: Angio Convert Enzyme: 37 U/L (ref 14–82)

## 2018-05-20 LAB — B. BURGDORFI ANTIBODIES

## 2018-05-20 LAB — VITAMIN B12: Vitamin B-12: 909 pg/mL (ref 232–1245)

## 2018-05-20 LAB — RPR: RPR Ser Ql: NONREACTIVE

## 2018-05-20 LAB — ANA W/REFLEX: Anti Nuclear Antibody(ANA): POSITIVE — AB

## 2018-05-21 ENCOUNTER — Telehealth: Payer: Self-pay | Admitting: Neurology

## 2018-05-21 NOTE — Telephone Encounter (Signed)
I called the patient.  The blood work showed a positive ANA with a fairly elevated SSA antibody, the patient could have Sjogren's syndrome, likely unrelated to her movement disorder.  The patient is not sure that she has Huntington's disease, genetic testing would confirm whether or not she had this, if she desires to have the genetic testing she will let me know.

## 2018-05-21 NOTE — Telephone Encounter (Signed)
Pts husband (not on DPR) states he would like to consult provider about the lab results pt just received before they get the MRI done. Please advise.

## 2018-05-24 ENCOUNTER — Telehealth: Payer: Self-pay | Admitting: Neurology

## 2018-05-24 NOTE — Telephone Encounter (Signed)
Noted  

## 2018-05-24 NOTE — Telephone Encounter (Signed)
Events noted

## 2018-05-24 NOTE — Telephone Encounter (Signed)
Pt called to inform us that she is needing to cancel her MRI that was scheduled. She states "It is not appropriate at this time for the test" Please advise.

## 2018-05-25 ENCOUNTER — Other Ambulatory Visit: Payer: Medicare Other

## 2018-09-16 ENCOUNTER — Ambulatory Visit: Payer: Medicare Other | Admitting: Neurology

## 2020-07-30 ENCOUNTER — Ambulatory Visit (INDEPENDENT_AMBULATORY_CARE_PROVIDER_SITE_OTHER): Payer: Medicare PPO | Admitting: Internal Medicine

## 2020-07-30 ENCOUNTER — Encounter: Payer: Self-pay | Admitting: Internal Medicine

## 2020-07-30 ENCOUNTER — Other Ambulatory Visit: Payer: Self-pay

## 2020-07-30 DIAGNOSIS — M79671 Pain in right foot: Secondary | ICD-10-CM

## 2020-07-30 MED ORDER — EPINEPHRINE 0.3 MG/0.3ML IJ SOAJ: 0.3000 mg | INTRAMUSCULAR | 1 refills | Status: AC | PRN

## 2020-07-30 NOTE — Assessment & Plan Note (Addendum)
I told her I don't think there is a wart--it looks like scar tissue Will set up with Dr Patsy Lager for gait evaluation and decide what else can be done for the pain with walking No obvious leg length discrepancy No previous orthopedic surgery

## 2020-07-30 NOTE — Progress Notes (Signed)
Subjective:    Patient ID: Cheryl Melendez, female    DOB: 1950/05/02, 70 y.o.   MRN: [id number]  HPI Here due to a possible wart on her foot This visit occurred during the SARS-CoV-2 public health emergency.  Safety protocols were in place, including screening questions prior to the visit, additional usage of staff PPE, and extensive cleaning of exam room while observing appropriate contact time as indicated for disinfecting solutions.   Has had a likely wart for years on her plantar right foot (15 years) Had surgery to remove the deep wart about 4 years ago Symptoms have recurred for months  New shoes--Mephisto---but still has pain  Current Outpatient Medications on File Prior to Visit  Medication Sig Dispense Refill  . acetaminophen (TYLENOL) 500 MG tablet Take 1,000 mg by mouth every 6 (six) hours as needed for moderate pain.    Marland Kitchen ALPRAZolam (XANAX) 0.5 MG tablet Take 2 tablets approximately 45 minutes prior to the MRI study, take a third tablet if needed. 3 tablet 0  . Calcium Carb-Cholecalciferol (CALCIUM 500+D PO) Take 1 tablet by mouth daily.    Marland Kitchen ibuprofen (ADVIL,MOTRIN) 200 MG tablet Take 600 mg by mouth every 6 (six) hours as needed for mild pain.    . Multiple Vitamin (MULTIVITAMIN) tablet Take 1 tablet by mouth daily.    . vitamin B-12 (CYANOCOBALAMIN) 1000 MCG tablet Take 1 tablet (1,000 mcg total) by mouth daily.     No current facility-administered medications on file prior to visit.    Allergies  Allergen Reactions  . Bee Venom Swelling  . Sulfonamide Derivatives     REACTION: rash    Past Medical History:  Diagnosis Date  . HLD (hyperlipidemia)   . Ruptured disk 2000    Past Surgical History:  Procedure Laterality Date  . CESAREAN SECTION     x 1  . CYSTECTOMY  1990's   L branchial cleft  . excision of mucoid cyst  12/16/2006   left thumb  . HAND SURGERY Right 05/2014   R finger PIP joint dislocation s/p pinning [facility])  . MRI back  2004   L4/5  ruptured disc with radiculopathy  . VAGINAL BIRTH AFTER CESAREAN SECTION     x 1  . wart removal     right foot     Family History  Problem Relation Age of Onset  . Hypertension Mother        Passed at 16  . Alzheimer's disease Mother   . Stroke Mother   . Dementia Mother   . Diabetes Father        Passed at 66  . Coronary artery disease Father 38       CABG x 4  . Stroke Father   . Hypertension Brother   . Alcohol abuse Brother   . Glaucoma Brother   . Arthritis Brother   . Obesity Brother        knee  . Alcohol abuse Brother   . Cancer Neg Hx     Social History   Socioeconomic History  . Marital status: Married    Spouse name: Not on file  . Number of children: 2  . Years of education: Not on file  . Highest education level: Master's degree (e.g., MA, MS, MEng, MEd, MSW, MBA)  Occupational History  . Occupation: Comptroller    Comment: Designer, [employer]  Tobacco Use  . Smoking status: Never Smoker  . Smokeless tobacco: Never Used  Vaping  Use  . Vaping Use: Some days  Substance and Sexual Activity  . Alcohol use: Yes    Comment: occ glass of wine  . Drug use: No  . Sexual activity: Yes  Other Topics Concern  . Not on file  Social History Narrative   Caffeine: 2-3 cups coffee/day   Married 1975 and lives with husband   Occupation: Comptroller at [employer]   Activity: walks, hikes, gardening, wants to restart yoga   Diet: good water, fruits/vegetables daily, red meat 3-4 x/wk, fish 1x/wk   Right handed    Social Determinants of Health   Financial Resource Strain: Not on file  Food Insecurity: Not on file  Transportation Needs: Not on file  Physical Activity: Not on file  Stress: Not on file  Social Connections: Not on file  Intimate Partner Violence: Not on file   Review of Systems     Objective:   Physical Exam Musculoskeletal:     Comments: Has area of apparent scar over base of 2nd metatarsal head on right Scraped slightly to  be sure--no clear wart No obvious foot abnormality  Neurological:     Comments: Somewhat antalgic gait--due to pain            Assessment & Plan:

## 2020-08-01 ENCOUNTER — Ambulatory Visit: Payer: Medicare PPO | Admitting: Family Medicine

## 2020-08-06 ENCOUNTER — Ambulatory Visit: Payer: Medicare PPO | Admitting: Family Medicine

## 2020-08-15 ENCOUNTER — Other Ambulatory Visit: Payer: Self-pay

## 2020-08-15 ENCOUNTER — Ambulatory Visit (INDEPENDENT_AMBULATORY_CARE_PROVIDER_SITE_OTHER): Payer: Medicare PPO | Admitting: Physician Assistant

## 2020-08-15 ENCOUNTER — Encounter: Payer: Self-pay | Admitting: Physician Assistant

## 2020-08-15 DIAGNOSIS — B079 Viral wart, unspecified: Secondary | ICD-10-CM | POA: Diagnosis not present

## 2020-08-15 DIAGNOSIS — Z1283 Encounter for screening for malignant neoplasm of skin: Secondary | ICD-10-CM | POA: Diagnosis not present

## 2020-08-15 DIAGNOSIS — L821 Other seborrheic keratosis: Secondary | ICD-10-CM

## 2020-08-15 DIAGNOSIS — L578 Other skin changes due to chronic exposure to nonionizing radiation: Secondary | ICD-10-CM

## 2020-08-15 DIAGNOSIS — L814 Other melanin hyperpigmentation: Secondary | ICD-10-CM

## 2020-08-15 NOTE — Patient Instructions (Addendum)
Instructions for Home Use of Dichloroacetic Acid (DCA)  ° °1. Keep your bottle upright in a very safe location with the lid secure. ° °2. After bathing, apply DCA with a toothpick or a "bald" cotton applicator. Apply a thin film, wait 30 seconds/until dry and cover with waterproof tape/the "sticky" part of the band-aid. Remove the tape/band-aid after 1 to 24 hours (gradually build up duration) and repeat 1-2 times weekly. ° °3. You may need to periodically remove the thick surface skin. Do this after bathing (before applying DCA) using a pumice stone or emery board or commercial rough skin remover. (REMEMBER to not use any of these on normal skin!!) ° °4. DCA is meant only for warts on the palms, fingers, and bottoms on feet. NEVER use it on any other areas. ° °5. This is a strong medicine. There is a risk of severe burn or scar. ° °6. We will recheck your progress in 1-2 months. Call the office if there are any questions or problems. °

## 2020-09-07 ENCOUNTER — Encounter: Payer: Self-pay | Admitting: Physician Assistant

## 2020-09-07 NOTE — Progress Notes (Signed)
   New Patient   Subjective  Cheryl Melendez is a 70 y.o. female who presents for the following: Warts (Plantar wart right foot).   The following portions of the chart were reviewed this encounter and updated as appropriate:  Tobacco  Allergies  Meds  Problems  Med Hx  Surg Hx  Fam Hx      Objective  Well appearing patient in no apparent distress; mood and affect are within normal limits.  All skin waist up examined.  Objective  Right 2nd Metatarsal Plantar Area: Verrucous papule -- Discussed viral etiology and contagion.    Assessment & Plan  Viral warts, unspecified type Right 2nd Metatarsal Plantar Area  Home dca  Lentigines - Scattered tan macules - Discussed due to sun exposure - Benign, observe - Call for any changes  Seborrheic Keratoses - Stuck-on, waxy, tan-brown papules and plaques  - Discussed benign etiology and prognosis. - Observe - Call for any changes  Actinic Damage - diffuse scaly erythematous macules with underlying dyspigmentation - Recommend daily broad spectrum sunscreen SPF 30+ to sun-exposed areas, reapply every 2 hours as needed.  - Call for new or changing lesions.  Skin cancer screening performed today.     I, Keilon Ressel, PA-C, have reviewed all documentation for this visit. The documentation on 09/07/20 for the exam, diagnosis, procedures, and orders are all accurate and complete.

## 2020-10-30 DIAGNOSIS — Z09 Encounter for follow-up examination after completed treatment for conditions other than malignant neoplasm: Secondary | ICD-10-CM | POA: Diagnosis not present

## 2020-10-31 DIAGNOSIS — S62635A Displaced fracture of distal phalanx of left ring finger, initial encounter for closed fracture: Secondary | ICD-10-CM | POA: Diagnosis not present

## 2020-10-31 DIAGNOSIS — S63285D Dislocation of proximal interphalangeal joint of left ring finger, subsequent encounter: Secondary | ICD-10-CM | POA: Diagnosis not present

## 2020-11-07 ENCOUNTER — Encounter: Payer: Self-pay | Admitting: Dermatology

## 2020-11-07 ENCOUNTER — Ambulatory Visit: Payer: Medicare PPO | Admitting: Dermatology

## 2020-11-07 ENCOUNTER — Other Ambulatory Visit: Payer: Self-pay

## 2020-11-07 DIAGNOSIS — L080 Pyoderma: Secondary | ICD-10-CM | POA: Diagnosis not present

## 2020-11-07 DIAGNOSIS — L089 Local infection of the skin and subcutaneous tissue, unspecified: Secondary | ICD-10-CM | POA: Diagnosis not present

## 2020-11-07 DIAGNOSIS — B070 Plantar wart: Secondary | ICD-10-CM

## 2020-11-07 MED ORDER — MUPIROCIN 2 % EX OINT: 1.0000 "application " | TOPICAL_OINTMENT | Freq: Every day | CUTANEOUS | 0 refills | Status: DC

## 2020-11-09 DIAGNOSIS — S63285D Dislocation of proximal interphalangeal joint of left ring finger, subsequent encounter: Secondary | ICD-10-CM | POA: Diagnosis not present

## 2020-11-09 DIAGNOSIS — S62635A Displaced fracture of distal phalanx of left ring finger, initial encounter for closed fracture: Secondary | ICD-10-CM | POA: Diagnosis not present

## 2020-11-12 ENCOUNTER — Telehealth: Payer: Self-pay | Admitting: Dermatology

## 2020-11-12 NOTE — Telephone Encounter (Signed)
Patient calling about wart on bottom of right foot that was treated in our office last week by ST. She states that she would like to talk to him about it. She states that area is starting to heal but she still has considerable amount of pain. That was all of the clinical information I was able to gather from patient.

## 2020-11-12 NOTE — Telephone Encounter (Signed)
Phone call to patient to let her know that her bacteria culture was negative and that its going to take the foot a few weeks to heal. She can try to keep it elevated to help with soreness and use the mupirocin.

## 2020-11-13 LAB — ANAEROBIC AND AEROBIC CULTURE
MICRO NUMBER:: 12141761
MICRO NUMBER:: 12141762
SPECIMEN QUALITY:: ADEQUATE
SPECIMEN QUALITY:: ADEQUATE

## 2020-11-17 ENCOUNTER — Encounter: Payer: Self-pay | Admitting: Dermatology

## 2020-11-17 NOTE — Progress Notes (Signed)
   Follow-Up Visit   Subjective  Cheryl Melendez is a 70 y.o. female who presents for the following: Warts (Patient here today for wart on the bottom of her right foot. Per patient it's painful today.).  Open area where she treated her plantar wart with more frequent than recommended TCA. Location:  Duration:  Quality:  Associated Signs/Symptoms: Modifying Factors:  Severity:  Timing: Context:   Objective  Well appearing patient in no apparent distress; mood and affect are within normal limits. Right 2nd Metatarsal Plantar Area Patient stated she use the [facility] with greater frequency than we recommended; this resulted in an open weeping area.  Examination showed a 6 mm moist erosion with peeling hyperkeratotic skin 5 mm around this.  Right 2nd Metatarsal Plantar Area Weepy open wound    A focused examination was performed including right foot and leg.. Relevant physical exam findings are noted in the Assessment and Plan.   Assessment & Plan    Plantar wart Right 2nd Metatarsal Plantar Area  The wound was debrided.  Culture obtained.  Hold on TCA.  Twice daily mupirocin pending culture.  Get off affected foot as much as possible.  Anaerobic and Aerobic Culture - Right 2nd Metatarsal Plantar Area  mupirocin ointment (BACTROBAN) 2 % - Right 2nd Metatarsal Plantar Area Apply 1 application topically daily.  Infection of skin and subcutaneous tissue  Related Procedures Anaerobic and Aerobic Culture  Related Medications mupirocin ointment (BACTROBAN) 2 % Apply 1 application topically daily.  Pyoderma (skin infection) Right 2nd Metatarsal Plantar Area  Culture, topical mupirocin twice daily.  Call      I, Janalyn Harder, MD, have reviewed all documentation for this visit.  The documentation on 11/17/20 for the exam, diagnosis, procedures, and orders are all accurate and complete.

## 2020-12-05 ENCOUNTER — Ambulatory Visit: Payer: Medicare PPO | Admitting: Family Medicine

## 2020-12-05 ENCOUNTER — Encounter: Payer: Self-pay | Admitting: Family Medicine

## 2020-12-05 ENCOUNTER — Other Ambulatory Visit: Payer: Self-pay

## 2020-12-05 VITALS — BP 158/80 | HR 100 | Temp 98.3°F | Ht 67.0 in | Wt 149.4 lb

## 2020-12-05 DIAGNOSIS — R300 Dysuria: Secondary | ICD-10-CM

## 2020-12-05 LAB — POC URINALSYSI DIPSTICK (AUTOMATED)
Bilirubin, UA: NEGATIVE
Blood, UA: NEGATIVE
Glucose, UA: NEGATIVE
Ketones, UA: 5
Leukocytes, UA: NEGATIVE
Nitrite, UA: NEGATIVE
Protein, UA: POSITIVE — AB
Spec Grav, UA: >=1.03 — AB (ref 1.010–1.025)
Urobilinogen, UA: 0.2 E.U./dL
pH, UA: 5.5 (ref 5.0–8.0)

## 2020-12-05 MED ORDER — CEPHALEXIN 500 MG PO CAPS: 500.0000 mg | ORAL_CAPSULE | Freq: Two times a day (BID) | ORAL | 0 refills | Status: DC

## 2020-12-05 NOTE — Assessment & Plan Note (Addendum)
With frequency and urgency 2 days  Some need to strain in order to urinate  ua quite concentrated with protein and ketones Enc her to drink water liberally  Handout given  Keflex px inst to call if symptoms suddenly worsen or change Will update with culture results

## 2020-12-05 NOTE — Patient Instructions (Signed)
Drink lots of fluids   Take the keflex as directed  Bring a sample back if needed   We will culture urine and then call you with a result

## 2020-12-05 NOTE — Progress Notes (Signed)
Subjective:    Patient ID: Cheryl Melendez, female    DOB: [birth year], 70 y.o.   MRN: [id number]  This visit occurred during the SARS-CoV-2 public health emergency.  Safety protocols were in place, including screening questions prior to the visit, additional usage of staff PPE, and extensive cleaning of exam room while observing appropriate contact time as indicated for disinfecting solutions.   HPI 70 yo pt of NP Baity presents with urinary symptoms   Wt Readings from Last 3 Encounters:  12/05/20 149 lb 6 oz (67.8 kg)  07/30/20 158 lb (71.7 kg)  05/18/18 162 lb (73.5 kg)   23.40 kg/m  Some bladder irritation for 2 d  Urinating frequently  Also urgency  Burning to urinate   Has to strain to urinate   No blood no fever or nausea   Concentrated urine with small protein and ketones   Results for orders placed or performed in visit on 12/05/20  POCT Urinalysis Dipstick (Automated)  Result Value Ref Range   Color, UA Dark Yellow    Clarity, UA Clear    Glucose, UA Negative Negative   Bilirubin, UA Negative    Ketones, UA 5 mg/dL    Spec Grav, UA >=1.916 (A) 1.010 - 1.025   Blood, UA Negative    pH, UA 5.5 5.0 - 8.0   Protein, UA Positive (A) Negative   Urobilinogen, UA 0.2 0.2 or 1.0 E.U./dL   Nitrite, UA Negative    Leukocytes, UA Negative Negative      Patient Active Problem List   Diagnosis Date Noted   Dysuria 12/05/2020   Right foot pain 07/30/2020   HYPERCHOLESTEROLEMIA 12/30/2006   Past Medical History:  Diagnosis Date   HLD (hyperlipidemia)    Ruptured disk 2000   Past Surgical History:  Procedure Laterality Date   CESAREAN SECTION     x 1   CYSTECTOMY  1990's   L branchial cleft   excision of mucoid cyst  12/16/2006   left thumb   HAND SURGERY Right 05/2014   R finger PIP joint dislocation s/p pinning ([facility])   MRI back  2004   L4/5 ruptured disc with radiculopathy   VAGINAL BIRTH AFTER CESAREAN SECTION     x 1   wart removal     right  foot    Social History   Tobacco Use   Smoking status: Never   Smokeless tobacco: Never  Vaping Use   Vaping Use: Some days  Substance Use Topics   Alcohol use: Yes    Comment: occ glass of wine   Drug use: No   Family History  Problem Relation Age of Onset   Hypertension Mother        Passed at 31   Alzheimer's disease Mother    Stroke Mother    Dementia Mother    Diabetes Father        Passed at 21   Coronary artery disease Father 95       CABG x 4   Stroke Father    Hypertension Brother    Alcohol abuse Brother    Glaucoma Brother    Arthritis Brother    Obesity Brother        knee   Alcohol abuse Brother    Cancer Neg Hx    Allergies  Allergen Reactions   Bee Venom Swelling   Sulfonamide Derivatives     REACTION: rash   Current Outpatient Medications on File Prior to Visit  Medication Sig Dispense Refill   acetaminophen (TYLENOL) 500 MG tablet Take 1,000 mg by mouth every 6 (six) hours as needed for moderate pain.     ALPRAZolam (XANAX) 0.5 MG tablet Take 2 tablets approximately 45 minutes prior to the MRI study, take a third tablet if needed. 3 tablet 0   Calcium Carb-Cholecalciferol (CALCIUM 500+D PO) Take 1 tablet by mouth daily.     EPINEPHrine 0.3 mg/0.3 mL IJ SOAJ injection Inject 0.3 mg into the muscle as needed for anaphylaxis. 1 each 1   HYDROcodone-acetaminophen (NORCO/VICODIN) 5-325 MG tablet hydrocodone 5 mg-acetaminophen 325 mg tablet  TAKE 1 TABLET BY MOUTH EVERY 6 HOURS AS NEEDED FOR 5 DAYS     ibuprofen (ADVIL,MOTRIN) 200 MG tablet Take 600 mg by mouth every 6 (six) hours as needed for mild pain.     Multiple Vitamin (MULTIVITAMIN) tablet Take 1 tablet by mouth daily.     mupirocin ointment (BACTROBAN) 2 % Apply 1 application topically daily. 22 g 0   ondansetron (ZOFRAN-ODT) 4 MG disintegrating tablet ondansetron 4 mg disintegrating tablet     oxyCODONE (OXY IR/ROXICODONE) 5 MG immediate release tablet oxycodone 5 mg tablet     vitamin B-12  (CYANOCOBALAMIN) 1000 MCG tablet Take 1 tablet (1,000 mcg total) by mouth daily.     No current facility-administered medications on file prior to visit.     Review of Systems  Constitutional:  Negative for chills, fatigue and fever.  Gastrointestinal:  Negative for constipation, nausea and vomiting.  Genitourinary:  Positive for difficulty urinating, dysuria, frequency and urgency. Negative for pelvic pain.  Musculoskeletal:  Negative for back pain.       No flank pain       Objective:   Physical Exam Constitutional:      General: She is not in acute distress.    Appearance: Normal appearance. She is normal weight. She is not ill-appearing.  Eyes:     Conjunctiva/sclera: Conjunctivae normal.     Pupils: Pupils are equal, round, and reactive to light.  Abdominal:     General: Abdomen is flat. Bowel sounds are normal. There is no distension.     Palpations: There is no mass.     Tenderness: There is abdominal tenderness. There is no right CVA tenderness or left CVA tenderness.     Comments: Some mild suprapubic tenderness  Bladder does not feel hyper distended   Skin:    General: Skin is warm and dry.  Neurological:     Mental Status: She is alert.     Comments: Significant ataxia note - per chart this is baseline  Some involuntary movements  Speech is slow  Psychiatric:        Mood and Affect: Mood normal.          Assessment & Plan:   Problem List Items Addressed This Visit       Other   Dysuria - Primary    With frequency and urgency 2 days  Some need to strain in order to urinate  ua quite concentrated with protein and ketones Enc her to drink water liberally  Handout given  Keflex px inst to call if symptoms suddenly worsen or change Will update with culture results         Relevant Orders   POCT Urinalysis Dipstick (Automated) (Completed)   Urine Culture

## 2020-12-06 LAB — URINE CULTURE
MICRO NUMBER:: 12255297
SPECIMEN QUALITY:: ADEQUATE

## 2020-12-27 DIAGNOSIS — F4323 Adjustment disorder with mixed anxiety and depressed mood: Secondary | ICD-10-CM | POA: Diagnosis not present

## 2021-01-01 DIAGNOSIS — F4323 Adjustment disorder with mixed anxiety and depressed mood: Secondary | ICD-10-CM | POA: Diagnosis not present

## 2021-01-08 ENCOUNTER — Encounter: Payer: Self-pay | Admitting: Dermatology

## 2021-01-08 ENCOUNTER — Ambulatory Visit: Payer: Medicare PPO | Admitting: Dermatology

## 2021-01-08 ENCOUNTER — Other Ambulatory Visit: Payer: Self-pay

## 2021-01-08 DIAGNOSIS — B070 Plantar wart: Secondary | ICD-10-CM

## 2021-01-09 DIAGNOSIS — F4323 Adjustment disorder with mixed anxiety and depressed mood: Secondary | ICD-10-CM | POA: Diagnosis not present

## 2021-01-21 ENCOUNTER — Encounter: Payer: Self-pay | Admitting: Dermatology

## 2021-01-21 NOTE — Progress Notes (Signed)
   Follow-Up Visit   Subjective  Cheryl Melendez is a 71 y.o. female who presents for the following: Warts (Bottom of right foot-  went away & now came back- used acid at home previously ).  Painful plantar warts Location:  Duration:  Quality:  Associated Signs/Symptoms: Modifying Factors:  Severity:  Timing: Context:   Objective  Well appearing patient in no apparent distress; mood and affect are within normal limits. Right 3rd Metatarsal Plantar Area For millimeter slightly tender hyper keratosis with an open abrasion (likely traumatic)    A focused examination was performed including hands and feet. Relevant physical exam findings are noted in the Assessment and Plan.   Assessment & Plan    Plantar wart Right 3rd Metatarsal Plantar Area  Pare DCA in the office only  Related Medications mupirocin ointment (BACTROBAN) 2 % Apply 1 application topically daily.      I, Janalyn Harder, MD, have reviewed all documentation for this visit.  The documentation on 01/21/21 for the exam, diagnosis, procedures, and orders are all accurate and complete.

## 2021-01-22 DIAGNOSIS — F4323 Adjustment disorder with mixed anxiety and depressed mood: Secondary | ICD-10-CM | POA: Diagnosis not present

## 2021-02-04 DIAGNOSIS — G4700 Insomnia, unspecified: Secondary | ICD-10-CM | POA: Diagnosis not present

## 2021-02-04 DIAGNOSIS — S0101XA Laceration without foreign body of scalp, initial encounter: Secondary | ICD-10-CM | POA: Diagnosis not present

## 2021-02-06 ENCOUNTER — Encounter (HOSPITAL_COMMUNITY): Payer: Self-pay | Admitting: Emergency Medicine

## 2021-02-06 ENCOUNTER — Emergency Department (HOSPITAL_COMMUNITY): Payer: Medicare PPO

## 2021-02-06 ENCOUNTER — Telehealth: Payer: Self-pay

## 2021-02-06 ENCOUNTER — Emergency Department (HOSPITAL_COMMUNITY)
Admission: EM | Admit: 2021-02-06 | Discharge: 2021-02-06 | Disposition: A | Discharge status: Home / Self Care | Payer: Medicare PPO | Attending: Emergency Medicine | Admitting: Emergency Medicine

## 2021-02-06 DIAGNOSIS — R4182 Altered mental status, unspecified: Secondary | ICD-10-CM | POA: Diagnosis not present

## 2021-02-06 DIAGNOSIS — R2681 Unsteadiness on feet: Secondary | ICD-10-CM | POA: Diagnosis present

## 2021-02-06 DIAGNOSIS — G4700 Insomnia, unspecified: Secondary | ICD-10-CM | POA: Diagnosis not present

## 2021-02-06 LAB — COMPREHENSIVE METABOLIC PANEL
ALT: 25 U/L (ref 0–44)
AST: 20 U/L (ref 15–41)
Albumin: 4.1 g/dL (ref 3.5–5.0)
Alkaline Phosphatase: 63 U/L (ref 38–126)
Anion gap: 7 (ref 5–15)
BUN: 20 mg/dL (ref 8–23)
CO2: 30 mmol/L (ref 22–32)
Calcium: 9.6 mg/dL (ref 8.9–10.3)
Chloride: 102 mmol/L (ref 98–111)
Creatinine, Ser: 0.78 mg/dL (ref 0.44–1.00)
GFR, Estimated: >60 mL/min (ref >60)
Glucose, Bld: 105 mg/dL — ABNORMAL HIGH (ref 70–99)
Potassium: 4.4 mmol/L (ref 3.5–5.1)
Sodium: 139 mmol/L (ref 135–145)
Total Bilirubin: 0.4 mg/dL (ref 0.3–1.2)
Total Protein: 6.7 g/dL (ref 6.5–8.1)

## 2021-02-06 LAB — CBC WITH DIFFERENTIAL/PLATELET
Abs Immature Granulocytes: 0.07 10*3/uL (ref 0.00–0.07)
Basophils Absolute: 0 10*3/uL (ref 0.0–0.1)
Basophils Relative: 0 %
Eosinophils Absolute: 0.1 10*3/uL (ref 0.0–0.5)
Eosinophils Relative: 1 %
HCT: 44.9 % (ref 36.0–46.0)
Hemoglobin: 14.1 g/dL (ref 12.0–15.0)
Immature Granulocytes: 1 %
Lymphocytes Relative: 20 %
Lymphs Abs: 1.4 10*3/uL (ref 0.7–4.0)
MCH: 30.9 pg (ref 26.0–34.0)
MCHC: 31.4 g/dL (ref 30.0–36.0)
MCV: 98.2 fL (ref 80.0–100.0)
Monocytes Absolute: 0.6 10*3/uL (ref 0.1–1.0)
Monocytes Relative: 9 %
Neutro Abs: 4.6 10*3/uL (ref 1.7–7.7)
Neutrophils Relative %: 69 %
Platelets: 302 10*3/uL (ref 150–400)
RBC: 4.57 MIL/uL (ref 3.87–5.11)
RDW: 13.1 % (ref 11.5–15.5)
WBC: 6.8 10*3/uL (ref 4.0–10.5)
nRBC: 0 % (ref 0.0–0.2)
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
## 2021-02-06 NOTE — ED Provider Notes (Signed)
[facility] EMERGENCY DEPARTMENT Provider Note   CSN: [id number] Arrival date & time: 02/06/21  1325     History Chief Complaint  Patient presents with   Insomnia    Cheryl Melendez is a 70 y.o. female.  Patient presents with chief complaint of unsteady gait, frequent falls, difficulty sleeping.      Past Medical History:  Diagnosis Date   HLD (hyperlipidemia)    Ruptured disk 2000    Patient Active Problem List   Diagnosis Date Noted   Dysuria 12/05/2020   Right foot pain 07/30/2020   HYPERCHOLESTEROLEMIA 12/30/2006    Past Surgical History:  Procedure Laterality Date   CESAREAN SECTION     x 1   CYSTECTOMY  1990's   L branchial cleft   excision of mucoid cyst  12/16/2006   left thumb   HAND SURGERY Right 05/2014   R finger PIP joint dislocation s/p pinning [facility])   MRI back  2004   L4/5 ruptured disc with radiculopathy   VAGINAL BIRTH AFTER CESAREAN SECTION     x 1   wart removal     right foot      OB History   No obstetric history on file.     Family History  Problem Relation Age of Onset   Hypertension Mother        Passed at 89   Alzheimer's disease Mother    Stroke Mother    Dementia Mother    Diabetes Father        Passed at 72   Coronary artery disease Father 24       CABG x 4   Stroke Father    Hypertension Brother    Alcohol abuse Brother    Glaucoma Brother    Arthritis Brother    Obesity Brother        knee   Alcohol abuse Brother    Cancer Neg Hx     Social History   Tobacco Use   Smoking status: Never   Smokeless tobacco: Never  Vaping Use   Vaping Use: Some days  Substance Use Topics   Alcohol use: Yes    Comment: occ glass of wine   Drug use: No    Home Medications Prior to Admission medications   Medication Sig Start Date End Date Taking? Authorizing Provider  acetaminophen (TYLENOL) 500 MG tablet Take 1,000 mg by mouth every 6 (six) hours as needed for moderate pain.    [provider]  ALPRAZolam Prudy Feeler) 0.5 MG tablet Take 2 tablets approximately 45 minutes prior to the MRI study, take a third tablet if needed. 05/18/18   York Spaniel, MD  Calcium Carb-Cholecalciferol (CALCIUM 500+D PO) Take 1 tablet by mouth daily.    [provider]  cephALEXin (KEFLEX) 500 MG capsule Take 1 capsule (500 mg total) by mouth 2 (two) times daily. 12/05/20   Tower, Audrie Gallus, MD  EPINEPHrine 0.3 mg/0.3 mL IJ SOAJ injection Inject 0.3 mg into the muscle as needed for anaphylaxis. 07/30/20   Karie Schwalbe, MD  HYDROcodone-acetaminophen (NORCO/VICODIN) 5-325 MG tablet hydrocodone 5 mg-acetaminophen 325 mg tablet  TAKE 1 TABLET BY MOUTH EVERY 6 HOURS AS NEEDED FOR 5 DAYS    [provider]  ibuprofen (ADVIL,MOTRIN) 200 MG tablet Take 600 mg by mouth every 6 (six) hours as needed for mild pain.    [provider]  Multiple Vitamin (MULTIVITAMIN) tablet Take 1 tablet by mouth daily.  [provider]  mupirocin ointment (BACTROBAN) 2 % Apply 1 application topically daily. 11/07/20   Janalyn Harder, MD  ondansetron (ZOFRAN-ODT) 4 MG disintegrating tablet ondansetron 4 mg disintegrating tablet    [provider]  oxyCODONE (OXY IR/ROXICODONE) 5 MG immediate release tablet oxycodone 5 mg tablet    [provider]  vitamin B-12 (CYANOCOBALAMIN) 1000 MCG tablet Take 1 tablet (1,000 mcg total) by mouth daily. 01/10/17   Eustaquio Boyden, MD    Allergies    Bee venom and Sulfonamide derivatives  Review of Systems   Review of Systems  Constitutional:  Negative for fever.  HENT:  Negative for ear pain.   Eyes:  Negative for pain.  Respiratory:  Negative for cough.   Cardiovascular:  Negative for chest pain.  Gastrointestinal:  Negative for abdominal pain.  Genitourinary:  Negative for flank pain.  Musculoskeletal:  Negative for back pain.  Skin:  Negative for rash.  Neurological:  Negative for headaches.   Physical  Exam Updated Vital Signs BP 136/69 (BP Location: Right Arm)   Pulse 98   Temp 98.2 F (36.8 C)   Resp 18   SpO2 99%   Physical Exam Constitutional:      General: She is not in acute distress.    Appearance: Normal appearance.  HENT:     Head: Normocephalic.     Nose: Nose normal.  Eyes:     Extraocular Movements: Extraocular movements intact.  Cardiovascular:     Rate and Rhythm: Normal rate.  Pulmonary:     Effort: Pulmonary effort is normal.  Musculoskeletal:        General: Normal range of motion.     Cervical back: Normal range of motion.  Neurological:     General: No focal deficit present.     Mental Status: She is alert and oriented to person, place, and time. Mental status is at baseline.     Cranial Nerves: No cranial nerve deficit.     Motor: No weakness.     Gait: Gait normal.    ED Results / Procedures / Treatments   Labs (all labs ordered are listed, but only abnormal results are displayed) Labs Reviewed  COMPREHENSIVE METABOLIC PANEL - Abnormal; Notable for the following components:      Result Value   Glucose, Bld 105 (*)    All other components within normal limits  URINE CULTURE  CBC WITH DIFFERENTIAL/PLATELET  URINALYSIS, ROUTINE W REFLEX MICROSCOPIC    EKG None  Radiology CT HEAD WO CONTRAST ( )  Result Date: 02/06/2021 CLINICAL DATA:  Mental status changes of unknown cause. Difficulty sleeping. Falling. Personality changes. EXAM: CT HEAD WITHOUT CONTRAST TECHNIQUE: Contiguous axial images were obtained from the base of the skull through the vertex without intravenous contrast. COMPARISON:  02/11/2018 FINDINGS: Brain: Mild age related volume loss. No evidence of focal infarction, mass lesion, hemorrhage, hydrocephalus or extra-axial collection. Vascular: There is atherosclerotic calcification of the major vessels at the base of the brain. Skull: No skull fracture. Sinuses/Orbits: Clear/normal Other: Skin staples right parietal vertex region.  IMPRESSION: No acute intracranial finding. No skull fracture. Mild age related atrophy. Mild chronic small-vessel change of the white matter. Right parietal scalp staples. Electronically Signed   By: Paulina Fusi M.D.   On: 02/06/2021 15:24    Procedures Procedures   Medications Ordered in ED Medications - No data to display  ED Course  I have reviewed the triage vital signs and the nursing notes.  Pertinent labs &  imaging results that were available during my care of the patient were reviewed by me and considered in my medical decision making (see chart for details).    MDM Rules/Calculators/A&P                           Work-up and labs unremarkable CT imaging shows no acute findings.  Advised patient follow-up with the sleep specialist, avoid bluelight before bedtime and try melatonin or Benadryl.  Advising immediate return for worsening symptoms fevers pain or any additional concerns.  Final Clinical Impression(s) / ED Diagnoses Final diagnoses:  Insomnia, unspecified type    Rx / DC Orders ED Discharge Orders     None        Cheryll Cockayne, MD 02/06/21 1624
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
## 2021-02-06 NOTE — Discharge Instructions (Addendum)
Your test did not show any new abnormalities on the CT imaging or blood test.  Follow-up with your primary care doctor as well as a sleep specialist as needed.  Return to the ER if you have fevers pain new numbness weakness or any additional concerns.

## 2021-02-06 NOTE — ED Provider Notes (Signed)
Emergency Medicine Provider Triage Evaluation Note  Cheryl Melendez , a 70 y.o. female  was evaluated in triage.  Pt complains of insomnia, decreased balance and increased falls.  Symptoms have been occurring over the last 3 to 4 months.  Patient suffered fall on 10/17 which required sutures.  No loss of consciousness at that time.  Patient did not have CT scan of head performed.  Patient is not on any blood thinners.  Review of Systems  Positive: Insomnia, decreased balance Negative: Numbness, weakness, dysarthria, facial asymmetry, dysuria  Physical Exam  BP 136/69 (BP Location: Right Arm)   Pulse 98   Temp 98.2 F (36.8 C)   Resp 18   SpO2 99%  Gen:   Awake, no distress   Resp:  Normal effort  MSK:   Moves extremities without difficulty  Other:  No facial asymmetry or dysarthria.  Negative pronator drift.  Medical Decision Making  Medically screening exam initiated at 2:01 PM.  Appropriate orders placed.  [name] was informed that the remainder of the evaluation will be completed by another provider, this initial triage assessment does not replace that evaluation, and the importance of remaining in the ED until their evaluation is complete.    Haskel Schroeder, PA-C 02/06/21 1402    Gwyneth Sprout, MD 02/13/21 [phone]

## 2021-02-06 NOTE — Telephone Encounter (Signed)
[facility] Day - Client TELEPHONE ADVICE RECORD AccessNurse Patient Name: Cheryl Melendez RK Gender: Female DOB: 02/17/1951 Age: 70 Y 10 M 25 D Return Phone Number: [phone] (Primary), [phone] (Secondary) Address: [place]  [address] Client [facility] Day - Client Client Site [facility] [place] - Day Physician AA - PHYSICIAN, NOT LISTED- MD Contact Type Call Who Is Calling Patient / Member / Family / Caregiver Call Type Triage / Clinical Caller Name Connor Foxworthy Relationship To Patient Other Return Phone Number [phone] (Secondary) Chief Complaint Anxiety and Panic Attack Reason for Call Symptomatic / Request for Health Information Initial Comment Caller states, patient needs to be triage. Sever anxiety, and depression. Not able to sleep at night. Sees Dr. Sampson Si, Translation No Nurse Assessment Nurse: Wyn Quaker, RN, Marylene Land Date/Time Lamount Cohen Time): 02/06/2021 10:02:45 AM Confirm and document reason for call. If symptomatic, describe symptoms. ---Caller stated that his wife is incoherent, hasn't slept in 5 days. She is repeating some words when I asked her birthdate. She has been falling more and having increased stressors. Does the patient have any new or worsening symptoms? ---Yes Will a triage be completed? ---Yes Related visit to physician within the last 2 weeks? ---Yes Does the PT have any chronic conditions? (i.e. diabetes, asthma, this includes High risk factors for pregnancy, etc.) ---Yes List chronic conditions. ---anxiety, insomnia, B12 Is this a behavioral health or substance abuse call? ---No Guidelines Guideline Title Affirmed Question Affirmed Notes Nurse Date/Time (Eastern Time) Neurologic Deficit Difficult to awaken or acting confused (e.g., disoriented, slurred speech) Dew, RN, Marylene Land 02/06/2021 10:05:56 AM Disp. Time Lamount Cohen Time) Disposition Final  User 02/06/2021 10:14:05 AM 911 Outcome Documentation Dew, RN, Marylene Land PLEASE NOTE: All timestamps contained within this report are represented as [place] Standard Time. CONFIDENTIALTY NOTICE: This fax transmission is intended only for the addressee. It contains information that is legally privileged, confidential or otherwise protected from use or disclosure. If you are not the intended recipient, you are strictly prohibited from reviewing, disclosing, copying using or disseminating any of this information or taking any action in reliance on or regarding this information. If you have received this fax in error, please notify us immediately by telephone so that we can arrange for its return to [place]. Phone: [phone], Toll-Free: [phone], Fax: [phone] Page: 2 of 2 Call Id: [id number] Disp. Time Lamount Cohen Time) Disposition Final User Reason: Refused; see notes 02/06/2021 10:08:40 AM Call EMS 911 Now Yes Dew, RN, Rosalyn Charters Disagree/Comply Disagree Caller Understands Yes PreDisposition InappropriateToAsk Care Advice Given Per Guideline CALL EMS 911 NOW: * Immediate medical attention is needed. You need to hang up and call 911 (or an ambulance). CARE ADVICE given per Neurologic Deficit (Adult) guideline. Comments User: Gifford Shave, RN Date/Time Lamount Cohen Time): 02/06/2021 10:13:07 AM Refusing to call 911 or go to the ER. Caller stated that his wife was evaluated yesterday. She hit her head, has garbled speech at times, and repeated her words. He is insistent that she didn't have any brain injury, can't have had a complication, and the neuro changes are from not sleeping. Referrals GO TO FACILITY REFUSE

## 2021-02-11 DIAGNOSIS — Z4802 Encounter for removal of sutures: Secondary | ICD-10-CM | POA: Diagnosis not present

## 2021-02-14 ENCOUNTER — Telehealth: Payer: Self-pay | Admitting: Internal Medicine

## 2021-02-14 DIAGNOSIS — R300 Dysuria: Secondary | ICD-10-CM | POA: Diagnosis not present

## 2021-02-14 DIAGNOSIS — R3589 Other polyuria: Secondary | ICD-10-CM | POA: Diagnosis not present

## 2021-02-14 DIAGNOSIS — R350 Frequency of micturition: Secondary | ICD-10-CM | POA: Diagnosis not present

## 2021-02-14 NOTE — Telephone Encounter (Signed)
error 

## 2021-02-21 DIAGNOSIS — F4323 Adjustment disorder with mixed anxiety and depressed mood: Secondary | ICD-10-CM | POA: Diagnosis not present

## 2021-02-25 DIAGNOSIS — F4323 Adjustment disorder with mixed anxiety and depressed mood: Secondary | ICD-10-CM | POA: Diagnosis not present

## 2021-02-27 ENCOUNTER — Ambulatory Visit: Payer: Medicare PPO | Admitting: Dermatology

## 2021-03-06 ENCOUNTER — Ambulatory Visit: Payer: Medicare PPO | Admitting: Dermatology

## 2021-03-21 DIAGNOSIS — F4323 Adjustment disorder with mixed anxiety and depressed mood: Secondary | ICD-10-CM | POA: Diagnosis not present

## 2021-04-02 DIAGNOSIS — M5416 Radiculopathy, lumbar region: Secondary | ICD-10-CM | POA: Insufficient documentation

## 2021-04-02 DIAGNOSIS — M545 Low back pain, unspecified: Secondary | ICD-10-CM | POA: Diagnosis not present

## 2021-04-02 DIAGNOSIS — R52 Pain, unspecified: Secondary | ICD-10-CM | POA: Insufficient documentation

## 2021-04-07 ENCOUNTER — Emergency Department (HOSPITAL_COMMUNITY)
Admission: EM | Admit: 2021-04-07 | Discharge: 2021-04-07 | Disposition: A | Discharge status: Home / Self Care | Payer: Medicare PPO | Attending: Emergency Medicine | Admitting: Emergency Medicine

## 2021-04-07 ENCOUNTER — Emergency Department (HOSPITAL_COMMUNITY): Payer: Medicare PPO

## 2021-04-07 ENCOUNTER — Encounter (HOSPITAL_COMMUNITY): Payer: Self-pay

## 2021-04-07 DIAGNOSIS — R457 State of emotional shock and stress, unspecified: Secondary | ICD-10-CM | POA: Diagnosis not present

## 2021-04-07 DIAGNOSIS — S0990XA Unspecified injury of head, initial encounter: Secondary | ICD-10-CM | POA: Diagnosis not present

## 2021-04-07 DIAGNOSIS — W19XXXA Unspecified fall, initial encounter: Secondary | ICD-10-CM

## 2021-04-07 DIAGNOSIS — M47812 Spondylosis without myelopathy or radiculopathy, cervical region: Secondary | ICD-10-CM | POA: Diagnosis not present

## 2021-04-07 DIAGNOSIS — W1839XA Other fall on same level, initial encounter: Secondary | ICD-10-CM | POA: Diagnosis not present

## 2021-04-07 DIAGNOSIS — I10 Essential (primary) hypertension: Secondary | ICD-10-CM | POA: Diagnosis not present

## 2021-04-07 DIAGNOSIS — S0003XA Contusion of scalp, initial encounter: Secondary | ICD-10-CM | POA: Diagnosis not present

## 2021-04-07 DIAGNOSIS — R42 Dizziness and giddiness: Secondary | ICD-10-CM | POA: Diagnosis not present

## 2021-04-07 LAB — BASIC METABOLIC PANEL
Anion gap: 8 (ref 5–15)
BUN: 19 mg/dL (ref 8–23)
CO2: 29 mmol/L (ref 22–32)
Calcium: 9.3 mg/dL (ref 8.9–10.3)
Chloride: 102 mmol/L (ref 98–111)
Creatinine, Ser: 0.74 mg/dL (ref 0.44–1.00)
GFR, Estimated: >60 mL/min (ref >60)
Glucose, Bld: 104 mg/dL — ABNORMAL HIGH (ref 70–99)
Potassium: 3.8 mmol/L (ref 3.5–5.1)
Sodium: 139 mmol/L (ref 135–145)

## 2021-04-07 LAB — URINALYSIS, MICROSCOPIC (REFLEX): Bacteria, UA: NONE SEEN

## 2021-04-07 LAB — URINALYSIS, ROUTINE W REFLEX MICROSCOPIC
Bilirubin Urine: NEGATIVE
Glucose, UA: NEGATIVE mg/dL
Ketones, ur: NEGATIVE mg/dL
Leukocytes,Ua: NEGATIVE
Nitrite: NEGATIVE
Protein, ur: NEGATIVE mg/dL
Specific Gravity, Urine: 1.015 (ref 1.005–1.030)
pH: 6 (ref 5.0–8.0)

## 2021-04-07 LAB — CBC
HCT: 41.9 % (ref 36.0–46.0)
Hemoglobin: 13.7 g/dL (ref 12.0–15.0)
MCH: 30.6 pg (ref 26.0–34.0)
MCHC: 32.7 g/dL (ref 30.0–36.0)
MCV: 93.7 fL (ref 80.0–100.0)
Platelets: 281 10*3/uL (ref 150–400)
RBC: 4.47 MIL/uL (ref 3.87–5.11)
RDW: 13 % (ref 11.5–15.5)
WBC: 8.7 10*3/uL (ref 4.0–10.5)
nRBC: 0 % (ref 0.0–0.2)

## 2021-04-07 NOTE — ED Triage Notes (Signed)
Pt comes via [facility] for a fall after some dizziness, hematoma to back of head. Pt has been having multiple falls lately.

## 2021-04-07 NOTE — Discharge Instructions (Signed)
You were seen today after a fall.  The CT scan and lab work is looking normal.  I am putting in a referral to see a neurologist for further evaluation.  They can arrange further advanced imaging as indicated.  Please keep your appointment with the primary care doctor in January.  I have ordered a case management consult and they should be reaching out to you to help schedule home health and arrange for a walker to use at home for increased stability.

## 2021-04-07 NOTE — ED Provider Notes (Signed)
Emergency Department Provider Note   I have reviewed the triage vital signs and the nursing notes.   HISTORY  Chief Complaint Fall   HPI Cheryl Melendez is a 70 y.o. female with PMH of HLD and frequent falls with sleep disturbance presents to the emergency department with her husband after 2 falls in the past 24 hours.  The patient's husband states that sleep disturbance and falls have been present but more frequent over the past several months.  He has noticed some occasional memory issues but also describes of what he describes as "anxiety."  He states that she feels anxious, worse at night, worse with meals.  Patient's husband provides most of the history and states that it started staying on the first floor of their three-story house because of falls.  Their PCP is close to renovation and they have established with a new practice but could not get an appointment until January 13th.  The patient had 2 falls yesterday.  The first fall was in the kitchen at around 7 PM.  The patient's husband was cooking and the patient seemed to lose her balance and fell directly backwards striking her head.  They did see a hematoma but monitored symptoms at home.  They ended up going to bed the patient was up around 3:30 in the morning going to the bathroom.  Husband did not witness this fall but heard it and the patient feels like she stumbled and then fell striking her head again.  After that second fall, they presented to the emergency department.   The husband did state that after a fall in the past couple of weeks she went to the outpatient orthopedic service with some lower back pain.  She was started on a steroid course along with tramadol at that time.  They did stop the steroids early with concern that may have made symptoms slightly worse.  They continue to use the tramadol and the husband finds that it does seem to help with pain, especially at night.  He has not noticed any particular association  with behavioral disturbance and tramadol at this point.  No other new medications.    Past Medical History:  Diagnosis Date   HLD (hyperlipidemia)    Ruptured disk 2000    Patient Active Problem List   Diagnosis Date Noted   Dysuria 12/05/2020   Right foot pain 07/30/2020   HYPERCHOLESTEROLEMIA 12/30/2006    Past Surgical History:  Procedure Laterality Date   CESAREAN SECTION     x 1   CYSTECTOMY  1990's   L branchial cleft   excision of mucoid cyst  12/16/2006   left thumb   HAND SURGERY Right 05/2014   R finger PIP joint dislocation s/p pinning [facility])   MRI back  2004   L4/5 ruptured disc with radiculopathy   VAGINAL BIRTH AFTER CESAREAN SECTION     x 1   wart removal     right foot     Allergies Bee venom and Sulfonamide derivatives  Family History  Problem Relation Age of Onset   Hypertension Mother        Passed at 45   Alzheimer's disease Mother    Stroke Mother    Dementia Mother    Diabetes Father        Passed at 14   Coronary artery disease Father 33       CABG x 4   Stroke Father    Hypertension Brother  Alcohol abuse Brother    Glaucoma Brother    Arthritis Brother    Obesity Brother        knee   Alcohol abuse Brother    Cancer Neg Hx     Social History Social History   Tobacco Use   Smoking status: Never   Smokeless tobacco: Never  Vaping Use   Vaping Use: Some days  Substance Use Topics   Alcohol use: Yes    Comment: occ glass of wine   Drug use: No    Review of Systems  Constitutional: No fever/chills Eyes: No visual changes. ENT: No sore throat. Cardiovascular: Denies chest pain. Respiratory: Denies shortness of breath. Gastrointestinal: No abdominal pain.  No nausea, no vomiting.  No diarrhea.  No constipation. Genitourinary: Negative for dysuria. Musculoskeletal: Negative for back pain. Skin: Negative for rash. Neurological: Negative for focal weakness or numbness. Positive HA.   10-point ROS otherwise  negative.  ____________________________________________   PHYSICAL EXAM:  VITAL SIGNS: ED Triage Vitals [04/07/21 0557]  Enc Vitals Group     BP 136/80     Pulse Rate 94     Resp 18     SpO2 97 %    Constitutional: Alert. Well appearing and in no acute distress. Eyes: Conjunctivae are normal. PERRL.  Head: Atraumatic. Nose: No congestion/rhinnorhea. Mouth/Throat: Mucous membranes are moist.  Oropharynx non-erythematous. Neck: No stridor. C collar  Cardiovascular: Normal rate, regular rhythm. Good peripheral circulation. Grossly normal heart sounds.   Respiratory: Normal respiratory effort.  No retractions. Lungs CTAB. Gastrointestinal: Soft and nontender. No distention.  Musculoskeletal: No lower extremity tenderness nor edema. No gross deformities of extremities. Neurologic:  Normal speech and language. No gross focal neurologic deficits are appreciated.  Skin:  Skin is warm, dry and intact. No rash noted.  ____________________________________________   LABS (all labs ordered are listed, but only abnormal results are displayed)  Labs Reviewed  BASIC METABOLIC PANEL - Abnormal; Notable for the following components:      Result Value   Glucose, Bld 104 (*)    All other components within normal limits  URINALYSIS, ROUTINE W REFLEX MICROSCOPIC - Abnormal; Notable for the following components:   Hgb urine dipstick TRACE (*)    All other components within normal limits  CBC  URINALYSIS, MICROSCOPIC (REFLEX)  CBG MONITORING, ED   ____________________________________________  EKG   EKG Interpretation  Date/Time:  Sunday April 07 2021 06:05:17 EST Ventricular Rate:  92 PR Interval:  138 QRS Duration: 80 QT Interval:  356 QTC Calculation: 440 R Axis:   87 Text Interpretation: Normal sinus rhythm Right atrial enlargement Borderline ECG Confirmed by Alona Bene [id number]) on 04/07/2021 7:22:58 AM         ____________________________________________  RADIOLOGY  CT HEAD WO CONTRAST ( )  Result Date: 04/07/2021 CLINICAL DATA:  70 year old female with history of dizziness. Multiple falls with injury to the head. EXAM: CT HEAD WITHOUT CONTRAST CT CERVICAL SPINE WITHOUT CONTRAST TECHNIQUE: Multidetector CT imaging of the head and cervical spine was performed following the standard protocol without intravenous contrast. Multiplanar CT image reconstructions of the cervical spine were also generated. COMPARISON:  Head CT 02/06/2021. FINDINGS: CT HEAD FINDINGS Brain: No evidence of acute infarction, hemorrhage, hydrocephalus, extra-axial collection or mass lesion/mass effect. Vascular: No hyperdense vessel or unexpected calcification. Skull: Normal. Negative for fracture or focal lesion. Sinuses/Orbits: No acute finding. Other: Large amount of high attenuation soft tissue swelling in the left parieto-occipital scalp compatible with a large scalp contusion/hematoma, where  there is also focal irregularity suggesting a laceration. CT CERVICAL SPINE FINDINGS Alignment: Normal. Skull base and vertebrae: No acute fracture. No primary bone lesion or focal pathologic process. Soft tissues and spinal canal: No prevertebral fluid or swelling. No visible canal hematoma. Disc levels: Multilevel degenerative disc disease, most pronounced at C5-C6. Mild multilevel facet arthropathy. Upper chest: Unremarkable. Other: None. IMPRESSION: 1. Large left parieto-occipital scalp contusion/hematoma with probable laceration. No underlying displaced skull fracture or signs of significant acute traumatic injury to the brain. 2. No evidence of significant acute traumatic injury to the cervical spine. 3. The appearance of the brain is normal. 4. Multilevel degenerative disc disease and cervical spondylosis, as above. Electronically Signed   By: Trudie Reed M.D.   On: 04/07/2021 07:21   CT CERVICAL SPINE WO CONTRAST  Result Date:  04/07/2021 CLINICAL DATA:  70 year old female with history of dizziness. Multiple falls with injury to the head. EXAM: CT HEAD WITHOUT CONTRAST CT CERVICAL SPINE WITHOUT CONTRAST TECHNIQUE: Multidetector CT imaging of the head and cervical spine was performed following the standard protocol without intravenous contrast. Multiplanar CT image reconstructions of the cervical spine were also generated. COMPARISON:  Head CT 02/06/2021. FINDINGS: CT HEAD FINDINGS Brain: No evidence of acute infarction, hemorrhage, hydrocephalus, extra-axial collection or mass lesion/mass effect. Vascular: No hyperdense vessel or unexpected calcification. Skull: Normal. Negative for fracture or focal lesion. Sinuses/Orbits: No acute finding. Other: Large amount of high attenuation soft tissue swelling in the left parieto-occipital scalp compatible with a large scalp contusion/hematoma, where there is also focal irregularity suggesting a laceration. CT CERVICAL SPINE FINDINGS Alignment: Normal. Skull base and vertebrae: No acute fracture. No primary bone lesion or focal pathologic process. Soft tissues and spinal canal: No prevertebral fluid or swelling. No visible canal hematoma. Disc levels: Multilevel degenerative disc disease, most pronounced at C5-C6. Mild multilevel facet arthropathy. Upper chest: Unremarkable. Other: None. IMPRESSION: 1. Large left parieto-occipital scalp contusion/hematoma with probable laceration. No underlying displaced skull fracture or signs of significant acute traumatic injury to the brain. 2. No evidence of significant acute traumatic injury to the cervical spine. 3. The appearance of the brain is normal. 4. Multilevel degenerative disc disease and cervical spondylosis, as above. Electronically Signed   By: Trudie Reed M.D.   On: 04/07/2021 07:21    ____________________________________________   PROCEDURES  Procedure(s) performed:   Procedures  None   ____________________________________________   INITIAL IMPRESSION / ASSESSMENT AND PLAN / ED COURSE  Pertinent labs & imaging results that were available during my care of the patient were reviewed by me and considered in my medical decision making (see chart for details).   Patient presents to the emergency department frequent falls.  The husband also describes some other behavioral type disturbance with what he describes anxiety along with poor sleep and memory issues.  This appears to be developing over the past several months.  The husband has been trying to get in with the primary care doctor and has since establish care with a new practice but does not have an appointment for several weeks.  Patient does not use a walker and she does not have home health.  Her neurologic exam here is within normal limits with no focal deficits to strongly suspect stroke.  This has been ongoing for months at this point and I do not feel that emergent MRI is indicated although further outpatient work-up would likely be warranted.  CT imaging of the head shows large hematoma.  I not appreciate a  laceration on exam.  Degenerative changes in the cervical spine but no fractures.  I was able to remove the patient cervical spine collar she has normal range of motion.  Lab work obtained during the triage process shows no evidence of urinary tract infection.  No leukocytosis to suspect infection.  No anemia.  No acute kidney injury or electrolyte disturbance.   We discussed adding a walker at home along with referral to home health for home nursing, PT/OT, and social work.  I suspect the patient's presentation is likely in the setting of some early neurocognitive decline, possibly early dementia, but cannot make that diagnosis firmly in the emergency department.  Will refer to neurology.  Patient has PCP follow-up scheduled.  Have consulted case management for home health and PT/OT assistance along with a walker DME.    10:34 AM  Have placed referral to neurology as an outpatient.  The patient's husband has a follow-up plan with the primary care doctor.  Have ordered home health and walker for home use. Case mgmt to follow at home.  ____________________________________________  FINAL CLINICAL IMPRESSION(S) / ED DIAGNOSES  Final diagnoses:  Fall, initial encounter  Injury of head, initial encounter    Note:  This document was prepared using Dragon voice recognition software and may include unintentional dictation errors.  Alona Bene, MD, [facility] Emergency Medicine    Asma Boldon, Arlyss Repress, MD 04/07/21 [phone]

## 2021-04-09 ENCOUNTER — Ambulatory Visit: Payer: Medicare PPO | Admitting: Diagnostic Neuroimaging

## 2021-04-09 ENCOUNTER — Encounter: Payer: Self-pay | Admitting: Diagnostic Neuroimaging

## 2021-04-09 VITALS — BP 144/74 | HR 107 | Ht 64.0 in | Wt 138.0 lb

## 2021-04-09 DIAGNOSIS — R471 Dysarthria and anarthria: Secondary | ICD-10-CM | POA: Diagnosis not present

## 2021-04-09 DIAGNOSIS — R270 Ataxia, unspecified: Secondary | ICD-10-CM

## 2021-04-09 DIAGNOSIS — R413 Other amnesia: Secondary | ICD-10-CM | POA: Diagnosis not present

## 2021-04-09 DIAGNOSIS — G255 Other chorea: Secondary | ICD-10-CM

## 2021-04-09 DIAGNOSIS — G249 Dystonia, unspecified: Secondary | ICD-10-CM | POA: Diagnosis not present

## 2021-04-09 NOTE — Progress Notes (Signed)
[facility]  PATIENT: Cheryl Melendez DOB: 15-Aug-1950  REFERRING CLINICIAN: Long, Arlyss Repress, MD HISTORY FROM: patient and husband REASON FOR VISIT: new consult   HISTORICAL  CHIEF COMPLAINT:  Chief Complaint  Patient presents with   Multiple falls    Rm 6 New Pt, ED FU  husband- Cheryl Melendez  "multiple falls, lack of sleep, stumbling, pre occupied"     HISTORY OF PRESENT ILLNESS:   UPDATE (04/09/21, VRP): 70 year old female here for evaluation of gait difficulty, abnormal movements, memory loss.  Patient was seen by Dr. [name] in 2020.  Symptoms have been going on since around 2015 with involuntary movements, slurred speech, gait difficulty.  Some of these issues were exacerbated in setting of anxiety and stress.  She was recommended to have MRI of the brain and consider Huntington's disease genetic testing but patient wanted to hold off at that time.  No definite family history of similar problems in her children or grandchildren.  Her mother had some type of neurologic issues but these were related to low oxygen level and brain injury according to the patient and husband.  More recently patient has been to the emergency room in October and December 2022 for recurrent gait difficulty and falling down.  Patient also having some PTSD and insomnia issues.  She has previously been seen by [facility] counseling psychiatry clinic.  PRIOR HPI (05/18/18, Dr. [name]): [name] is a 70 year old right-handed white female with a history of a gait disorder that has been present since around 2015.  The patient comes in today with her husband.  The patient has had some problems with being fidgety throughout her life, but this has gotten worse over the last several years.  Within the last several months, the patient has had some alteration in her speech, she has had some slight slurring.  She has fallen on several occasions recently, she fell down some stairs on 11 February 2018 and bumped  her head, CT scan of the brain at that time was unremarkable.  The patient fell again on 13 April 2018.  The patient has had falls off and on throughout the years since 2015.  The patient was discovered to have a significant vitamin B12 deficiency in 2018, she is on oral supplementation at this time.  The patient began to feel much better when she went on the B12 supplementation.  The patient indicates that her mother in her 4s began having problems with being somewhat fidgety and falling a lot prior to her death at age 66.  No other family members are noted to have walking problems.  The patient has 2 children, neither child has difficulty with walking.  The patient reports some anxiety problems, possibly some depression.  She reports no numbness or weakness of the extremities, she denies issues controlling the bowels or the bladder.  She has not had any problems with memory or problems with swallowing.  She is sent to this office for an evaluation.     REVIEW OF SYSTEMS: Full 14 system review of systems performed and negative with exception of: as per hpi.  ALLERGIES: Allergies  Allergen Reactions   Bee Venom Swelling   Sulfonamide Derivatives     REACTION: rash    HOME MEDICATIONS: Outpatient Medications Prior to Visit  Medication Sig Dispense Refill   acetaminophen (TYLENOL) 500 MG tablet Take 1,000 mg by mouth every 6 (six) hours as needed for moderate pain.     Calcium Carb-Cholecalciferol (CALCIUM 500+D PO) Take  1 tablet by mouth daily.     EPINEPHrine 0.3 mg/0.3 mL IJ SOAJ injection Inject 0.3 mg into the muscle as needed for anaphylaxis. 1 each 1   ibuprofen (ADVIL,MOTRIN) 200 MG tablet Take 600 mg by mouth every 6 (six) hours as needed for mild pain.     meloxicam (MOBIC) 15 MG tablet Take 15 mg by mouth daily as needed.     Multiple Vitamin (MULTIVITAMIN) tablet Take 1 tablet by mouth daily.     traMADol (ULTRAM) 50 MG tablet Take 50 mg by mouth every 8 (eight) hours as needed.      ALPRAZolam (XANAX) 0.5 MG tablet Take 2 tablets approximately 45 minutes prior to the MRI study, take a third tablet if needed. 3 tablet 0   cephALEXin (KEFLEX) 500 MG capsule Take 1 capsule (500 mg total) by mouth 2 (two) times daily. 14 capsule 0   HYDROcodone-acetaminophen (NORCO/VICODIN) 5-325 MG tablet hydrocodone 5 mg-acetaminophen 325 mg tablet  TAKE 1 TABLET BY MOUTH EVERY 6 HOURS AS NEEDED FOR 5 DAYS (Patient not taking: Reported on 04/09/2021)     mupirocin ointment (BACTROBAN) 2 % Apply 1 application topically daily. 22 g 0   ondansetron (ZOFRAN-ODT) 4 MG disintegrating tablet ondansetron 4 mg disintegrating tablet     oxyCODONE (OXY IR/ROXICODONE) 5 MG immediate release tablet oxycodone 5 mg tablet (Patient not taking: Reported on 04/09/2021)     vitamin B-12 (CYANOCOBALAMIN) 1000 MCG tablet Take 1 tablet (1,000 mcg total) by mouth daily.     No facility-administered medications prior to visit.    PAST MEDICAL HISTORY: Past Medical History:  Diagnosis Date   HLD (hyperlipidemia)    Ruptured disk 2000    PAST SURGICAL HISTORY: Past Surgical History:  Procedure Laterality Date   CESAREAN SECTION     x 1   CYSTECTOMY  1990's   L branchial cleft   excision of mucoid cyst  12/16/2006   left thumb   HAND SURGERY Right 05/2014   R finger PIP joint dislocation s/p pinning ([place])   MRI back  2004   L4/5 ruptured disc with radiculopathy   VAGINAL BIRTH AFTER CESAREAN SECTION     x 1   wart removal     right foot     FAMILY HISTORY: Family History  Problem Relation Age of Onset   Hypertension Mother        Passed at 25   Alzheimer's disease Mother    Stroke Mother    Dementia Mother    Diabetes Father        Passed at 59   Coronary artery disease Father 3       CABG x 4   Stroke Father    Hypertension Brother    Alcohol abuse Brother    Glaucoma Brother    Arthritis Brother    Obesity Brother        knee   Alcohol abuse Brother    Cancer Neg Hx      SOCIAL HISTORY: Social History   Socioeconomic History   Marital status: Married    Spouse name: Cheryl Melendez   Number of children: 2   Years of education: Not on file   Highest education level: Master's degree (e.g., MA, MS, MEng, MEd, MSW, MBA)  Occupational History   Occupation: Nursing Teacher    Comment: Designer, [employer]  Tobacco Use   Smoking status: Never   Smokeless tobacco: Never  Vaping Use   Vaping Use: Some days  Substance  and Sexual Activity   Alcohol use: Not Currently   Drug use: No   Sexual activity: Yes  Other Topics Concern   Not on file  Social History Narrative   Caffeine: 2-3 cups coffee/day   Married 1975 and lives with husband   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   Activity: walks, hikes, gardening, wants to restart yoga   Diet: good water, fruits/vegetables daily, red meat 3-4 x/wk, fish 1x/wk   Right handed    Social Determinants of Health   Financial Resource Strain: Not on file  Food Insecurity: Not on file  Transportation Needs: Not on file  Physical Activity: Not on file  Stress: Not on file  Social Connections: Not on file  Intimate Partner Violence: Not on file     PHYSICAL EXAM  GENERAL EXAM/CONSTITUTIONAL: Vitals:  Vitals:   04/09/21 1333  BP: (!) 144/74  Pulse: (!) 107  Weight: 138 lb (62.6 kg)  Height: 5\' 4"  (1.626 m)   Body mass index is 23.69 kg/m. Wt Readings from Last 3 Encounters:  04/09/21 138 lb (62.6 kg)  12/05/20 149 lb 6 oz (67.8 kg)  07/30/20 158 lb (71.7 kg)   Patient is in no distress; well developed, nourished and groomed; neck is supple  CARDIOVASCULAR: Examination of carotid arteries is normal; no carotid bruits Regular rate and rhythm, no murmurs Examination of peripheral vascular system by observation and palpation is normal  EYES: Ophthalmoscopic exam of optic discs and posterior segments is normal; no papilledema or hemorrhages No results found.  MUSCULOSKELETAL: Gait,  strength, tone, movements noted in Neurologic exam below  NEUROLOGIC: MENTAL STATUS:  No flowsheet data found. awake, alert, oriented to person, place and time recent and remote memory intact normal attention and concentration language fluent, comprehension intact, naming intact fund of knowledge appropriate  CRANIAL NERVE:  2nd - no papilledema on fundoscopic exam 2nd, 3rd, 4th, 6th - pupils equal and reactive to light, visual fields full to confrontation, extraocular muscles intact, no nystagmus 5th - facial sensation symmetric 7th - facial strength symmetric 8th - hearing intact 9th - palate elevates symmetrically, uvula midline 11th - shoulder shrug symmetric 12th - tongue protrusion midline ORAL-LINGUAL DYSKINESIAS  MOTOR:  DYSTONIA POSTURING OF RIGHT > LEFT HANDS CHOREIFORM MOVEMENTS IN ARMS AND LEGS INCREASED TONE IN LUE AND LLE DIFFUSE 4/5 STRENGTH  SENSORY:  normal and symmetric to light touch, temperature, vibration  COORDINATION:  finger-nose-finger, fine finger movements normal  REFLEXES:  deep tendon reflexes present and symmetric  GAIT/STATION:  narrow based gait; ATAXIC GAIT UNSTEADY; RESTLESS     DIAGNOSTIC DATA (LABS, IMAGING, TESTING) - I reviewed patient records, labs, notes, testing and imaging myself where available.  Lab Results  Component Value Date   WBC 8.7 04/07/2021   HGB 13.7 04/07/2021   HCT 41.9 04/07/2021   MCV 93.7 04/07/2021   PLT 281 04/07/2021      Component Value Date/Time   NA 139 04/07/2021 0626   K 3.8 04/07/2021 0626   CL 102 04/07/2021 0626   CO2 29 04/07/2021 0626   GLUCOSE 104 (H) 04/07/2021 0626   BUN 19 04/07/2021 0626   CREATININE 0.74 04/07/2021 0626   CALCIUM 9.3 04/07/2021 0626   PROT 6.7 02/06/2021 1359   ALBUMIN 4.1 02/06/2021 1359   AST 20 02/06/2021 1359   ALT 25 02/06/2021 1359   ALKPHOS 63 02/06/2021 1359   BILITOT 0.4 02/06/2021 1359   GFRNONAA >60 04/07/2021 0626   GFRAA 84 01/21/2007 0915  Lab Results  Component Value Date   CHOL 199 01/14/2018   HDL 63.10 01/14/2018   LDLCALC 103 (H) 01/14/2018   LDLDIRECT 126.1 12/03/2011   TRIG 162.0 (H) 01/14/2018   CHOLHDL 3 01/14/2018   Lab Results  Component Value Date   HGBA1C 5.9 11/17/2016   Lab Results  Component Value Date   VITAMINB12 909 05/18/2018   Lab Results  Component Value Date   TSH 1.95 01/09/2017    04/07/21 CT head / cervical spine 1. Large left parieto-occipital scalp contusion/hematoma with probable laceration. No underlying displaced skull fracture or signs of significant acute traumatic injury to the brain. 2. No evidence of significant acute traumatic injury to the cervical spine. 3. The appearance of the brain is normal. 4. Multilevel degenerative disc disease and cervical spondylosis, as above.    ASSESSMENT AND PLAN  70 y.o. year old female here with progressive chorea, dystonia, memory loss, gait difficulty since 2015.   Ddx: wilson's dz, neuroferritinopathy, huntingtons, SLE chorea (prior positive ANA), spinocerebellar ataxia, paraneoplastic syndrome  1. Chorea   2. Dystonia   3. Memory loss   4. Ataxia   5. Dysarthria     PLAN:  HYPERKINETIC MOVEMENT DISORDER (chorea, dystonia, ataxia; dysarthria, memory loss) - check MRI brain and labs - consider genetic counseling / testing for huntington's  DEPRESSION / ANXIETY / PTSD / INSOMNIA - follow up with [facility]  Orders Placed This Encounter  Procedures   MR BRAIN W WO CONTRAST   Vitamin B12   MMA   Homocysteine   A1c   TSH   SPEP with IFE   ANA w/Reflex   SSA, SSB   ANCA Profile   Copper   Paraneoplastic Autoanti. Eval   Ceruloplasmin   Iron, TIBC and Ferritin Panel   CBC with Differential/Platelet   Comp Spinocerebellar Ataxia   Return in about 4 months (around 08/08/2021).    Suanne Marker, MD 04/09/2021, 2:34 PM Certified in Neurology, Neurophysiology and Neuroimaging  [facility] [address] [place], [place] [address] [phone]

## 2021-04-09 NOTE — Patient Instructions (Signed)
°  HYPERKINETIC MOVEMENT DISORDER (chorea, athetosis, dystonia, ataxia; dysarthria, memory loss) - check MRI brain and labs - consider genetic counseling / testing for huntington's  DEPRESSION / ANXIETY / PTSD / INSOMNIA - follow up with [facility]

## 2021-04-19 ENCOUNTER — Telehealth: Payer: Self-pay | Admitting: Diagnostic Neuroimaging

## 2021-04-19 NOTE — Telephone Encounter (Signed)
Pt's husband called wanting to know when they can get the pt's lab results. Please advise.

## 2021-04-23 NOTE — Telephone Encounter (Signed)
LVM informing patient that her labs are ok, except SSA is elevated with unclear significance, and the SCA panel is pending. Advised Dr Docia Barrier will wait for all results before determining next steps. Left # for questions.

## 2021-05-01 DIAGNOSIS — F4323 Adjustment disorder with mixed anxiety and depressed mood: Secondary | ICD-10-CM | POA: Diagnosis not present

## 2021-05-03 DIAGNOSIS — M5441 Lumbago with sciatica, right side: Secondary | ICD-10-CM | POA: Diagnosis not present

## 2021-05-03 DIAGNOSIS — Z0000 Encounter for general adult medical examination without abnormal findings: Secondary | ICD-10-CM | POA: Diagnosis not present

## 2021-05-03 DIAGNOSIS — F418 Other specified anxiety disorders: Secondary | ICD-10-CM | POA: Diagnosis not present

## 2021-05-03 DIAGNOSIS — G8929 Other chronic pain: Secondary | ICD-10-CM | POA: Diagnosis not present

## 2021-05-03 DIAGNOSIS — F331 Major depressive disorder, recurrent, moderate: Secondary | ICD-10-CM | POA: Diagnosis not present

## 2021-05-03 DIAGNOSIS — R296 Repeated falls: Secondary | ICD-10-CM | POA: Diagnosis not present

## 2021-05-03 DIAGNOSIS — F4310 Post-traumatic stress disorder, unspecified: Secondary | ICD-10-CM | POA: Diagnosis not present

## 2021-05-03 DIAGNOSIS — F5105 Insomnia due to other mental disorder: Secondary | ICD-10-CM | POA: Diagnosis not present

## 2021-05-03 DIAGNOSIS — E785 Hyperlipidemia, unspecified: Secondary | ICD-10-CM | POA: Diagnosis not present

## 2021-05-04 ENCOUNTER — Ambulatory Visit
Admission: RE | Admit: 2021-05-04 | Discharge: 2021-05-04 | Disposition: A | Discharge status: Home / Self Care | Payer: Medicare PPO | Source: Ambulatory Visit | Attending: Diagnostic Neuroimaging | Admitting: Diagnostic Neuroimaging

## 2021-05-04 ENCOUNTER — Other Ambulatory Visit: Payer: Self-pay

## 2021-05-04 DIAGNOSIS — R471 Dysarthria and anarthria: Secondary | ICD-10-CM

## 2021-05-04 DIAGNOSIS — R413 Other amnesia: Secondary | ICD-10-CM

## 2021-05-04 DIAGNOSIS — R270 Ataxia, unspecified: Secondary | ICD-10-CM

## 2021-05-04 DIAGNOSIS — G255 Other chorea: Secondary | ICD-10-CM

## 2021-05-04 DIAGNOSIS — G249 Dystonia, unspecified: Secondary | ICD-10-CM

## 2021-05-06 ENCOUNTER — Telehealth: Payer: Self-pay | Admitting: Diagnostic Neuroimaging

## 2021-05-06 MED ORDER — ALPRAZOLAM 0.5 MG PO TABS: ORAL_TABLET | ORAL | 0 refills | Status: DC

## 2021-05-06 NOTE — Telephone Encounter (Signed)
Pt's husband Lovelee Forner pt needs a prescription for Alprazolam .5mg  to calm for her MRI schedule on 05/08/21.

## 2021-05-06 NOTE — Telephone Encounter (Signed)
Meds ordered this encounter  Medications   ALPRAZolam (XANAX) 0.5 MG tablet    Sig: Take one tab 30-45 min prior to MRI, may take additional tab as needed before MRI.    Dispense:  2 tablet    Refill:  0    Suanne Marker, MD 05/06/2021, 1:00 PM Certified in Neurology, Neurophysiology and Neuroimaging  [facility] [address], [address] [place], [place] [address] [phone]

## 2021-05-08 ENCOUNTER — Other Ambulatory Visit: Payer: Medicare PPO

## 2021-05-09 LAB — COMP SPINOCEREBELLAR ATAXIA

## 2021-05-09 LAB — ANCA PROFILE
Anti-MPO Antibodies: <0.2 units (ref 0.0–0.9)
Anti-PR3 Antibodies: <0.2 units (ref 0.0–0.9)
Atypical pANCA: <1:20 {titer}
C-ANCA: <1:20 {titer}
P-ANCA: <1:20 {titer}

## 2021-05-09 LAB — PARANEOPLASTIC AB
AGNA-1: NEGATIVE
Amphiphysin Antibody: NEGATIVE
Anti-Hu Ab: NEGATIVE
Anti-Ri Ab: NEGATIVE
Anti-Yo Ab: NEGATIVE
Antineruonal nuclear Ab Type 3: NEGATIVE
CASPR2 Antibody,Cell-based IFA: NEGATIVE
CRMP-5 IgG: NEGATIVE
Interpretation: NEGATIVE
LGI1 Antibody, Cell-based IFA: NEGATIVE
Purkinje Cell Cyto Ab Type 2: NEGATIVE
Purkinje Cell Cyto Ab Type Tr: NEGATIVE
VGCC Antibody: <1 pmol/L (ref 0.0–30.0)

## 2021-05-09 LAB — MULTIPLE MYELOMA PANEL, SERUM
Albumin SerPl Elph-Mcnc: 3.9 g/dL (ref 2.9–4.4)
Albumin/Glob SerPl: 1.4 (ref 0.7–1.7)
Alpha 1: 0.3 g/dL (ref 0.0–0.4)
Alpha2 Glob SerPl Elph-Mcnc: 0.8 g/dL (ref 0.4–1.0)
B-Globulin SerPl Elph-Mcnc: 1 g/dL (ref 0.7–1.3)
Gamma Glob SerPl Elph-Mcnc: 0.8 g/dL (ref 0.4–1.8)
Globulin, Total: 2.8 g/dL (ref 2.2–3.9)
IgA/Immunoglobulin A, Serum: 141 mg/dL (ref 87–352)
IgG (Immunoglobin G), Serum: 822 mg/dL (ref 586–1602)
IgM (Immunoglobulin M), Srm: 53 mg/dL (ref 26–217)
Total Protein: 6.7 g/dL (ref 6.0–8.5)

## 2021-05-09 LAB — CBC WITH DIFFERENTIAL/PLATELET
Basophils Absolute: 0.1 10*3/uL (ref 0.0–0.2)
Basos: 1 %
EOS (ABSOLUTE): 0 10*3/uL (ref 0.0–0.4)
Eos: 0 %
Hematocrit: 39.3 % (ref 34.0–46.6)
Hemoglobin: 13.2 g/dL (ref 11.1–15.9)
Immature Grans (Abs): 0.1 10*3/uL (ref 0.0–0.1)
Immature Granulocytes: 1 %
Lymphocytes Absolute: 1.2 10*3/uL (ref 0.7–3.1)
Lymphs: 12 %
MCH: 30.5 pg (ref 26.6–33.0)
MCHC: 33.6 g/dL (ref 31.5–35.7)
MCV: 91 fL (ref 79–97)
Monocytes Absolute: 0.7 10*3/uL (ref 0.1–0.9)
Monocytes: 7 %
Neutrophils Absolute: 8.1 10*3/uL — ABNORMAL HIGH (ref 1.4–7.0)
Neutrophils: 79 %
Platelets: 292 10*3/uL (ref 150–450)
RBC: 4.33 x10E6/uL (ref 3.77–5.28)
RDW: 12.2 % (ref 11.7–15.4)
WBC: 10.1 10*3/uL (ref 3.4–10.8)

## 2021-05-09 LAB — HOMOCYSTEINE: Homocysteine: 8.4 umol/L (ref 0.0–17.2)

## 2021-05-09 LAB — IRON,TIBC AND FERRITIN PANEL
Ferritin: 81 ng/mL (ref 15–150)
Iron Saturation: 10 % — ABNORMAL LOW (ref 15–55)
Iron: 34 ug/dL (ref 27–139)
Total Iron Binding Capacity: 326 ug/dL (ref 250–450)
UIBC: 292 ug/dL (ref 118–369)

## 2021-05-09 LAB — CERULOPLASMIN: Ceruloplasmin: 21.1 mg/dL (ref 19.0–39.0)

## 2021-05-09 LAB — ANA W/REFLEX: ANA Titer 1: NEGATIVE

## 2021-05-09 LAB — HEMOGLOBIN A1C
Est. average glucose Bld gHb Est-mCnc: 114 mg/dL
Hgb A1c MFr Bld: 5.6 % (ref 4.8–5.6)

## 2021-05-09 LAB — SJOGREN'S SYNDROME ANTIBODS(SSA + SSB)
ENA SSA (RO) Ab: >8 AI — ABNORMAL HIGH (ref 0.0–0.9)
ENA SSB (LA) Ab: <0.2 AI (ref 0.0–0.9)

## 2021-05-09 LAB — COPPER, SERUM: Copper: 97 ug/dL (ref 80–158)

## 2021-05-09 LAB — TSH: TSH: 0.823 u[IU]/mL (ref 0.450–4.500)

## 2021-05-09 LAB — METHYLMALONIC ACID, SERUM: Methylmalonic Acid: 142 nmol/L (ref 0–378)

## 2021-05-09 LAB — VITAMIN B12: Vitamin B-12: >2000 pg/mL — ABNORMAL HIGH (ref 232–1245)

## 2021-05-13 ENCOUNTER — Other Ambulatory Visit: Payer: Self-pay | Admitting: Internal Medicine

## 2021-05-13 DIAGNOSIS — Z13820 Encounter for screening for osteoporosis: Secondary | ICD-10-CM

## 2021-05-27 ENCOUNTER — Telehealth: Payer: Self-pay | Admitting: Diagnostic Neuroimaging

## 2021-05-27 NOTE — Telephone Encounter (Signed)
Cheryl Melendez: [id number] (exp. 05/28/21 to 06/27/21)  Patient is scheduled at GI for 05/28/21.

## 2021-05-28 ENCOUNTER — Ambulatory Visit
Admission: RE | Admit: 2021-05-28 | Discharge: 2021-05-28 | Disposition: A | Discharge status: Home / Self Care | Payer: Medicare PPO | Source: Ambulatory Visit | Attending: Diagnostic Neuroimaging | Admitting: Diagnostic Neuroimaging

## 2021-05-28 DIAGNOSIS — G255 Other chorea: Secondary | ICD-10-CM | POA: Diagnosis not present

## 2021-05-28 DIAGNOSIS — R270 Ataxia, unspecified: Secondary | ICD-10-CM | POA: Diagnosis not present

## 2021-05-28 DIAGNOSIS — R413 Other amnesia: Secondary | ICD-10-CM | POA: Diagnosis not present

## 2021-05-28 DIAGNOSIS — G249 Dystonia, unspecified: Secondary | ICD-10-CM | POA: Diagnosis not present

## 2021-05-28 MED ORDER — GADOBENATE DIMEGLUMINE 529 MG/ML IV SOLN
12.0000 mL | Freq: Once | INTRAVENOUS | Status: AC | PRN
  Administered 2021-05-28: 12 mL via INTRAVENOUS

## 2021-06-10 DIAGNOSIS — Z1231 Encounter for screening mammogram for malignant neoplasm of breast: Secondary | ICD-10-CM | POA: Diagnosis not present

## 2021-06-17 DIAGNOSIS — M5416 Radiculopathy, lumbar region: Secondary | ICD-10-CM | POA: Diagnosis not present

## 2021-06-17 DIAGNOSIS — M542 Cervicalgia: Secondary | ICD-10-CM | POA: Diagnosis not present

## 2021-06-21 DIAGNOSIS — M542 Cervicalgia: Secondary | ICD-10-CM | POA: Diagnosis not present

## 2021-06-21 DIAGNOSIS — M545 Low back pain, unspecified: Secondary | ICD-10-CM | POA: Diagnosis not present

## 2021-06-25 DIAGNOSIS — F4323 Adjustment disorder with mixed anxiety and depressed mood: Secondary | ICD-10-CM | POA: Diagnosis not present

## 2021-07-04 ENCOUNTER — Telehealth (INDEPENDENT_AMBULATORY_CARE_PROVIDER_SITE_OTHER): Payer: Self-pay | Admitting: Dermatology

## 2021-07-04 DIAGNOSIS — B070 Plantar wart: Secondary | ICD-10-CM

## 2021-07-04 NOTE — Telephone Encounter (Signed)
Patient is calling to get a refill on [facility] for the same wart that she has been treating  off and on since her last visit in September 2022.  (Patient did schedule an office visit for a new plantar wart). ?

## 2021-07-08 NOTE — Telephone Encounter (Signed)
Informed patient husband Health and safety inspector) she can have more [facility]- she will have to come by the office to pick up and pay for it. Patient spouse understood.  ?

## 2021-07-10 NOTE — Telephone Encounter (Signed)
Patient came in the office today to pick up home [facility]. ?

## 2021-07-10 NOTE — Progress Notes (Signed)
This encounter was created in error - please disregard.

## 2021-07-12 DIAGNOSIS — M545 Low back pain, unspecified: Secondary | ICD-10-CM | POA: Diagnosis not present

## 2021-07-31 DIAGNOSIS — M5416 Radiculopathy, lumbar region: Secondary | ICD-10-CM | POA: Diagnosis not present

## 2021-08-05 DIAGNOSIS — F4323 Adjustment disorder with mixed anxiety and depressed mood: Secondary | ICD-10-CM | POA: Diagnosis not present

## 2021-08-07 ENCOUNTER — Ambulatory Visit: Payer: Medicare PPO | Admitting: Diagnostic Neuroimaging

## 2021-08-08 DIAGNOSIS — M5416 Radiculopathy, lumbar region: Secondary | ICD-10-CM | POA: Diagnosis not present

## 2021-08-09 DIAGNOSIS — M24542 Contracture, left hand: Secondary | ICD-10-CM | POA: Diagnosis not present

## 2021-08-09 DIAGNOSIS — M24541 Contracture, right hand: Secondary | ICD-10-CM | POA: Diagnosis not present

## 2021-08-12 ENCOUNTER — Ambulatory Visit: Payer: Medicare PPO | Admitting: Diagnostic Neuroimaging

## 2021-08-12 ENCOUNTER — Encounter: Payer: Self-pay | Admitting: Diagnostic Neuroimaging

## 2021-08-12 VITALS — BP 123/67 | HR 92 | Ht 66.0 in | Wt 147.0 lb

## 2021-08-12 DIAGNOSIS — G118 Other hereditary ataxias: Secondary | ICD-10-CM | POA: Diagnosis not present

## 2021-08-12 DIAGNOSIS — G249 Dystonia, unspecified: Secondary | ICD-10-CM | POA: Diagnosis not present

## 2021-08-12 DIAGNOSIS — R413 Other amnesia: Secondary | ICD-10-CM | POA: Diagnosis not present

## 2021-08-12 DIAGNOSIS — M5416 Radiculopathy, lumbar region: Secondary | ICD-10-CM | POA: Diagnosis not present

## 2021-08-12 DIAGNOSIS — G255 Other chorea: Secondary | ICD-10-CM | POA: Diagnosis not present

## 2021-08-12 MED ORDER — TETRABENAZINE 12.5 MG PO TABS: 12.5000 mg | ORAL_TABLET | Freq: Two times a day (BID) | ORAL | 6 refills | Status: AC

## 2021-08-12 NOTE — Progress Notes (Signed)
? ?[facility] ? ?PATIENT: Cheryl Melendez ?DOB: 1950/11/23 ? ?REFERRING CLINICIAN: No ref. provider found ?HISTORY FROM: patient and husband ?REASON FOR VISIT: follow up ? ? ?HISTORICAL ? ?CHIEF COMPLAINT:  ?Chief Complaint  ?Patient presents with  ? Chorea  ?  Rm 7, 4 month FU  husband- [name]  "doing better, less falls, in PT"  ? ? ?HISTORY OF PRESENT ILLNESS:  ? ?UPDATE (08/12/21, VRP): Since last visit, doing about the same. Symptoms are stable. No alleviating or aggravating factors. Labs reviewed --> SCA17 is possible now.   ? ?UPDATE (04/09/21, VRP): 71 year old female here for evaluation of gait difficulty, abnormal movements, memory loss.  Patient was seen by Dr. [name] in 2020.  Symptoms have been going on since around 2015 with involuntary movements, slurred speech, gait difficulty.  Some of these issues were exacerbated in setting of anxiety and stress.  She was recommended to have MRI of the brain and consider Huntington's disease genetic testing but patient wanted to hold off at that time.  No definite family history of similar problems in her children or grandchildren.  Her mother had some type of neurologic issues but these were related to low oxygen level and brain injury according to the patient and husband. ? ?More recently patient has been to the emergency room in October and December 2022 for recurrent gait difficulty and falling down.  Patient also having some PTSD and insomnia issues.  She has previously been seen by [facility] counseling psychiatry clinic. ? ?PRIOR HPI (05/18/18, Dr. [name]): Ms. 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  The patient comes in today with her husband.  The patient has had some problems with being fidgety throughout her life, but this has gotten worse over the last several years.  Within the last several months, the patient has had some alteration in her speech,  she has had some slight slurring.  She has fallen on several occasions recently, she fell down some stairs on 11 February 2018 and bumped her head, CT scan of the brain at that time was unremarkable.  The patient fell again on 13 April 2018.  The patient has had falls off and on throughout the years since 2015.  The patient was discovered to have a significant vitamin B12 deficiency in 2018, she is on oral supplementation at this time.  The patient began to feel much better when she went on the B12 supplementation.  The patient indicates that her mother in her 73s began having problems with being somewhat fidgety and falling a lot prior to her death at age 82.  No other family members are noted to have walking problems.  The patient has 2 children, neither child has difficulty with walking.  The patient reports some anxiety problems, possibly some depression.  She reports no numbness or weakness of the extremities, she denies issues controlling the bowels or the bladder.  She has not had any problems with memory or problems with swallowing.  She is sent to this office for an evaluation. ?  ? ? ?REVIEW OF SYSTEMS: Full 14 system review of systems performed and negative with exception of: as per hpi. ? ?ALLERGIES: ?Allergies  ?Allergen Reactions  ? Bee Venom Swelling  ? Sulfonamide Derivatives   ?  REACTION: rash  ? ? ?HOME MEDICATIONS: ?Outpatient Medications Prior to Visit  ?Medication Sig Dispense Refill  ? acetaminophen (TYLENOL) 500 MG tablet Take  1,000 mg by mouth every 6 (six) hours as needed for moderate pain.    ? Ascorbic Acid (VITAMIN C) 500 MG CAPS Take by mouth.    ? Calcium Carb-Cholecalciferol (CALCIUM 500+D PO) Take 1 tablet by mouth daily.    ? citalopram (CELEXA) 20 MG tablet Take 20 mg by mouth at bedtime.    ? EPINEPHrine 0.3 mg/0.3 mL IJ SOAJ injection Inject 0.3 mg into the muscle as needed for anaphylaxis. 1 each 1  ? meloxicam (MOBIC) 15 MG tablet Take 15 mg by mouth daily as needed.    ?  Multiple Vitamin (MULTIVITAMIN) tablet Take 1 tablet by mouth daily.    ? Omega-3 Fatty Acids (FISH OIL BURP-LESS PO) Take by mouth. Dose unknown    ? traMADol (ULTRAM) 50 MG tablet Take 50 mg by mouth every 8 (eight) hours as needed.    ? traZODone (DESYREL) 100 MG tablet Take 100 mg by mouth at bedtime.    ? ALPRAZolam (XANAX) 0.5 MG tablet Take one tab 30-45 min prior to MRI, may take additional tab as needed before MRI. 2 tablet 0  ? ibuprofen (ADVIL,MOTRIN) 200 MG tablet Take 600 mg by mouth every 6 (six) hours as needed for mild pain.    ? ?No facility-administered medications prior to visit.  ? ? ?PAST MEDICAL HISTORY: ?Past Medical History:  ?Diagnosis Date  ? HLD (hyperlipidemia)   ? Ruptured disk 2000  ? ? ?PAST SURGICAL HISTORY: ?Past Surgical History:  ?Procedure Laterality Date  ? CESAREAN SECTION    ? x 1  ? CYSTECTOMY  1990's  ? L branchial cleft  ? excision of mucoid cyst  12/16/2006  ? left thumb  ? HAND SURGERY Right 05/2014  ? R finger PIP joint dislocation s/p pinning Mina Marble([place])  ? MRI back  2004  ? L4/5 ruptured disc with radiculopathy  ? VAGINAL BIRTH AFTER CESAREAN SECTION    ? x 1  ? wart removal    ? right foot   ? ? ?FAMILY HISTORY: ?Family History  ?Problem Relation Age of Onset  ? Hypertension Mother   ?     Passed at 1486  ? Alzheimer's disease Mother   ? Stroke Mother   ? Dementia Mother   ? Diabetes Father   ?     Passed at 3192  ? Coronary artery disease Father 2678  ?     CABG x 4  ? Stroke Father   ? Hypertension Brother   ? Alcohol abuse Brother   ? Glaucoma Brother   ? Arthritis Brother   ? Obesity Brother   ?     knee  ? Alcohol abuse Brother   ? Cancer Neg Hx   ? ? ?SOCIAL HISTORY: ?Social History  ? ?Socioeconomic History  ? Marital status: Married  ?  Spouse name: Zollie BeckersWalter  ? Number of children: 2  ? Years of education: Not on file  ? Highest education level: Master's degree (e.g., MA, MS, MEng, MEd, MSW, MBA)  ?Occupational History  ? Occupation: Comptrollerursing Teacher  ?  Comment: [employer]  ?Tobacco Use  ? Smoking status: Never  ? Smokeless tobacco: Never  ?Vaping Use  ? Vaping Use: Some days  ?Substance and Sexual Activity  ? Alcohol use: Not Currently  ? Drug use: No  ? Sexual activity: Yes  ?Other Topics Concern  ? Not on file  ?Social History Narrative  ? Caffeine: 2-3 cups coffee/day  ? Married 1975 and lives  with husband  ? 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 Activity: walks, hikes, gardening, wants to restart yoga  ? Diet: good water, fruits/vegetables daily, red meat 3-4 x/wk, fish 1x/wk  ? Right handed   ? ?Social Determinants of Health  ? ?Financial Resource Strain: Not on file  ?Food Insecurity: Not on file  ?Transportation Needs: Not on file  ?Physical Activity: Not on file  ?Stress: Not on file  ?Social Connections: Not on file  ?Intimate Partner Violence: Not on file  ? ? ? ?PHYSICAL EXAM ? ?GENERAL EXAM/CONSTITUTIONAL: ?Vitals:  ?Vitals:  ? 08/12/21 1516  ?BP: 123/67  ?Pulse: 92  ?Weight: 147 lb (66.7 kg)  ?Height: 5\' 6"  (1.676 m)  ? ?Body mass index is 23.73 kg/m?. ?Wt Readings from Last 3 Encounters:  ?08/12/21 147 lb (66.7 kg)  ?04/09/21 138 lb (62.6 kg)  ?12/05/20 149 lb 6 oz (67.8 kg)  ? ?Patient is in no distress; well developed, nourished and groomed; neck is supple ? ?CARDIOVASCULAR: ?Examination of carotid arteries is normal; no carotid bruits ?Regular rate and rhythm, no murmurs ?Examination of peripheral vascular system by observation and palpation is normal ? ?EYES: ?Ophthalmoscopic exam of optic discs and posterior segments is normal; no papilledema or hemorrhages ?No results found. ? ?MUSCULOSKELETAL: ?Gait, strength, tone, movements noted in Neurologic exam below ? ?NEUROLOGIC: ?MENTAL STATUS:  ?   ? View : No data to display.  ?  ?  ?  ? ?awake, alert, oriented to person, place and time ?recent and remote memory intact ?normal attention and concentration ?language fluent, comprehension intact, naming intact ?fund of knowledge  appropriate ? ?CRANIAL NERVE:  ?2nd - no papilledema on fundoscopic exam ?2nd, 3rd, 4th, 6th - pupils equal and reactive to light, visual fields full to confrontation, extraocular muscles intact, no nystagmus ?5th - faci

## 2021-08-14 ENCOUNTER — Telehealth: Payer: Self-pay | Admitting: *Deleted

## 2021-08-14 NOTE — Telephone Encounter (Signed)
Approved today ?PA Case: [id number], Coverage Starts on: 04/21/2021 12:00:00 AM, Coverage Ends on: 04/20/2022 12:00:00 AM.  ?Questions? Contact [phone]. ?Faxed approval to [facility].  ?F [id number] ?P [phone] ?

## 2021-08-14 NOTE — Telephone Encounter (Signed)
Tetrabenazine PA, key [id number], G25.5, G24.9.  Received message: ?additional information required. Answered clinical questions, faxed office notes to be attached.  ?

## 2021-08-14 NOTE — Telephone Encounter (Signed)
Received email stating documents are attached. PA submitted. ?Your information has been submitted to [facility]. [employer] will review the request and will issue a decision, typically within 3-7 days from your submission.  ?If [employer] has not responded in 3-7 days or if you have any questions about your ePA request, please contact [employer] at [phone] ?

## 2021-08-15 ENCOUNTER — Telehealth: Payer: Self-pay | Admitting: Diagnostic Neuroimaging

## 2021-08-15 DIAGNOSIS — R748 Abnormal levels of other serum enzymes: Secondary | ICD-10-CM | POA: Diagnosis not present

## 2021-08-15 DIAGNOSIS — G259 Extrapyramidal and movement disorder, unspecified: Secondary | ICD-10-CM | POA: Diagnosis not present

## 2021-08-15 DIAGNOSIS — Z1211 Encounter for screening for malignant neoplasm of colon: Secondary | ICD-10-CM | POA: Diagnosis not present

## 2021-08-15 DIAGNOSIS — Z13820 Encounter for screening for osteoporosis: Secondary | ICD-10-CM | POA: Diagnosis not present

## 2021-08-15 DIAGNOSIS — M5416 Radiculopathy, lumbar region: Secondary | ICD-10-CM | POA: Diagnosis not present

## 2021-08-15 DIAGNOSIS — F419 Anxiety disorder, unspecified: Secondary | ICD-10-CM | POA: Diagnosis not present

## 2021-08-15 DIAGNOSIS — M5441 Lumbago with sciatica, right side: Secondary | ICD-10-CM | POA: Diagnosis not present

## 2021-08-15 DIAGNOSIS — F5105 Insomnia due to other mental disorder: Secondary | ICD-10-CM | POA: Diagnosis not present

## 2021-08-15 DIAGNOSIS — Z13220 Encounter for screening for lipoid disorders: Secondary | ICD-10-CM | POA: Diagnosis not present

## 2021-08-15 NOTE — Telephone Encounter (Signed)
Referral for Oncology sent to  [facility] [place] [phone]. 

## 2021-08-16 DIAGNOSIS — F419 Anxiety disorder, unspecified: Secondary | ICD-10-CM | POA: Diagnosis not present

## 2021-08-16 DIAGNOSIS — R748 Abnormal levels of other serum enzymes: Secondary | ICD-10-CM | POA: Diagnosis not present

## 2021-08-16 DIAGNOSIS — G259 Extrapyramidal and movement disorder, unspecified: Secondary | ICD-10-CM | POA: Diagnosis not present

## 2021-08-16 DIAGNOSIS — Z13220 Encounter for screening for lipoid disorders: Secondary | ICD-10-CM | POA: Diagnosis not present

## 2021-08-16 DIAGNOSIS — Z1211 Encounter for screening for malignant neoplasm of colon: Secondary | ICD-10-CM | POA: Diagnosis not present

## 2021-08-16 DIAGNOSIS — Z136 Encounter for screening for cardiovascular disorders: Secondary | ICD-10-CM | POA: Diagnosis not present

## 2021-08-19 DIAGNOSIS — M5416 Radiculopathy, lumbar region: Secondary | ICD-10-CM | POA: Diagnosis not present

## 2021-08-20 DIAGNOSIS — M24542 Contracture, left hand: Secondary | ICD-10-CM | POA: Diagnosis not present

## 2021-08-20 DIAGNOSIS — M24541 Contracture, right hand: Secondary | ICD-10-CM | POA: Diagnosis not present

## 2021-08-21 DIAGNOSIS — M85851 Other specified disorders of bone density and structure, right thigh: Secondary | ICD-10-CM | POA: Diagnosis not present

## 2021-08-21 DIAGNOSIS — Z780 Asymptomatic menopausal state: Secondary | ICD-10-CM | POA: Diagnosis not present

## 2021-08-21 DIAGNOSIS — M85852 Other specified disorders of bone density and structure, left thigh: Secondary | ICD-10-CM | POA: Diagnosis not present

## 2021-08-22 DIAGNOSIS — M24542 Contracture, left hand: Secondary | ICD-10-CM | POA: Diagnosis not present

## 2021-08-22 DIAGNOSIS — M24541 Contracture, right hand: Secondary | ICD-10-CM | POA: Diagnosis not present

## 2021-08-23 DIAGNOSIS — M5416 Radiculopathy, lumbar region: Secondary | ICD-10-CM | POA: Diagnosis not present

## 2021-08-26 DIAGNOSIS — M5416 Radiculopathy, lumbar region: Secondary | ICD-10-CM | POA: Diagnosis not present

## 2021-08-27 ENCOUNTER — Ambulatory Visit: Payer: Medicare PPO | Admitting: Dermatology

## 2021-08-27 DIAGNOSIS — B070 Plantar wart: Secondary | ICD-10-CM

## 2021-08-27 DIAGNOSIS — M24541 Contracture, right hand: Secondary | ICD-10-CM | POA: Diagnosis not present

## 2021-08-27 DIAGNOSIS — F4323 Adjustment disorder with mixed anxiety and depressed mood: Secondary | ICD-10-CM | POA: Diagnosis not present

## 2021-08-27 DIAGNOSIS — M24542 Contracture, left hand: Secondary | ICD-10-CM | POA: Diagnosis not present

## 2021-08-29 DIAGNOSIS — M24542 Contracture, left hand: Secondary | ICD-10-CM | POA: Diagnosis not present

## 2021-08-29 DIAGNOSIS — M24541 Contracture, right hand: Secondary | ICD-10-CM | POA: Diagnosis not present

## 2021-09-02 DIAGNOSIS — M5416 Radiculopathy, lumbar region: Secondary | ICD-10-CM | POA: Diagnosis not present

## 2021-09-03 DIAGNOSIS — M24541 Contracture, right hand: Secondary | ICD-10-CM | POA: Diagnosis not present

## 2021-09-03 DIAGNOSIS — M24542 Contracture, left hand: Secondary | ICD-10-CM | POA: Diagnosis not present

## 2021-09-05 DIAGNOSIS — M24541 Contracture, right hand: Secondary | ICD-10-CM | POA: Diagnosis not present

## 2021-09-05 DIAGNOSIS — M24542 Contracture, left hand: Secondary | ICD-10-CM | POA: Diagnosis not present

## 2021-09-05 DIAGNOSIS — F4323 Adjustment disorder with mixed anxiety and depressed mood: Secondary | ICD-10-CM | POA: Diagnosis not present

## 2021-09-09 DIAGNOSIS — M5416 Radiculopathy, lumbar region: Secondary | ICD-10-CM | POA: Diagnosis not present

## 2021-09-11 DIAGNOSIS — M24541 Contracture, right hand: Secondary | ICD-10-CM | POA: Diagnosis not present

## 2021-09-11 DIAGNOSIS — M24542 Contracture, left hand: Secondary | ICD-10-CM | POA: Diagnosis not present

## 2021-09-12 DIAGNOSIS — H0102B Squamous blepharitis left eye, upper and lower eyelids: Secondary | ICD-10-CM | POA: Diagnosis not present

## 2021-09-12 DIAGNOSIS — H0102A Squamous blepharitis right eye, upper and lower eyelids: Secondary | ICD-10-CM | POA: Diagnosis not present

## 2021-09-12 DIAGNOSIS — H2513 Age-related nuclear cataract, bilateral: Secondary | ICD-10-CM | POA: Diagnosis not present

## 2021-09-12 DIAGNOSIS — H04123 Dry eye syndrome of bilateral lacrimal glands: Secondary | ICD-10-CM | POA: Diagnosis not present

## 2021-09-12 DIAGNOSIS — H10413 Chronic giant papillary conjunctivitis, bilateral: Secondary | ICD-10-CM | POA: Diagnosis not present

## 2021-09-12 DIAGNOSIS — L719 Rosacea, unspecified: Secondary | ICD-10-CM | POA: Diagnosis not present

## 2021-09-14 ENCOUNTER — Encounter: Payer: Self-pay | Admitting: Dermatology

## 2021-09-14 NOTE — Progress Notes (Signed)
   Follow-Up Visit   Subjective  Cheryl Melendez is a 71 y.o. female who presents for the following: Follow-up (On plantar wart on bottom of right foot. Was paired and DCA in office. ).  Painful plantar wart, previous treatment helpful Location:  Duration:  Quality:  Associated Signs/Symptoms: Modifying Factors:  Severity:  Timing: Context:   Objective  Well appearing patient in no apparent distress; mood and affect are within normal limits. Right Middle Plantar Surface Somewhat tender plantar hyperkeratosis    A focused examination was performed including hands, feet.. Relevant physical exam findings are noted in the Assessment and Plan.   Assessment & Plan    Plantar wart Right Middle Plantar Surface  Paring plus dichloroacetic acid.      I, Janalyn Harder, MD, have reviewed all documentation for this visit.  The documentation on 09/14/21 for the exam, diagnosis, procedures, and orders are all accurate and complete.

## 2021-09-26 DIAGNOSIS — M24542 Contracture, left hand: Secondary | ICD-10-CM | POA: Diagnosis not present

## 2021-09-26 DIAGNOSIS — M24541 Contracture, right hand: Secondary | ICD-10-CM | POA: Diagnosis not present

## 2021-10-02 DIAGNOSIS — M24542 Contracture, left hand: Secondary | ICD-10-CM | POA: Diagnosis not present

## 2021-10-02 DIAGNOSIS — M24541 Contracture, right hand: Secondary | ICD-10-CM | POA: Diagnosis not present

## 2021-10-09 DIAGNOSIS — M24542 Contracture, left hand: Secondary | ICD-10-CM | POA: Diagnosis not present

## 2021-10-09 DIAGNOSIS — M24541 Contracture, right hand: Secondary | ICD-10-CM | POA: Diagnosis not present

## 2021-10-15 DIAGNOSIS — M24542 Contracture, left hand: Secondary | ICD-10-CM | POA: Diagnosis not present

## 2021-10-15 DIAGNOSIS — F4323 Adjustment disorder with mixed anxiety and depressed mood: Secondary | ICD-10-CM | POA: Diagnosis not present

## 2021-10-15 DIAGNOSIS — M24541 Contracture, right hand: Secondary | ICD-10-CM | POA: Diagnosis not present

## 2021-12-31 DIAGNOSIS — F4323 Adjustment disorder with mixed anxiety and depressed mood: Secondary | ICD-10-CM | POA: Diagnosis not present

## 2022-01-17 DIAGNOSIS — Z23 Encounter for immunization: Secondary | ICD-10-CM | POA: Diagnosis not present

## 2022-01-17 DIAGNOSIS — R2689 Other abnormalities of gait and mobility: Secondary | ICD-10-CM | POA: Diagnosis not present

## 2022-01-17 DIAGNOSIS — R296 Repeated falls: Secondary | ICD-10-CM | POA: Diagnosis not present

## 2022-01-17 DIAGNOSIS — F331 Major depressive disorder, recurrent, moderate: Secondary | ICD-10-CM | POA: Diagnosis not present

## 2022-01-17 DIAGNOSIS — H8110 Benign paroxysmal vertigo, unspecified ear: Secondary | ICD-10-CM | POA: Diagnosis not present

## 2022-01-17 DIAGNOSIS — F5105 Insomnia due to other mental disorder: Secondary | ICD-10-CM | POA: Diagnosis not present

## 2022-01-21 DIAGNOSIS — F4323 Adjustment disorder with mixed anxiety and depressed mood: Secondary | ICD-10-CM | POA: Diagnosis not present

## 2022-02-03 ENCOUNTER — Ambulatory Visit: Payer: Medicare PPO | Admitting: Physical Therapy

## 2022-02-10 DIAGNOSIS — R030 Elevated blood-pressure reading, without diagnosis of hypertension: Secondary | ICD-10-CM | POA: Diagnosis not present

## 2022-02-10 DIAGNOSIS — B070 Plantar wart: Secondary | ICD-10-CM | POA: Diagnosis not present

## 2022-02-10 DIAGNOSIS — S0512XA Contusion of eyeball and orbital tissues, left eye, initial encounter: Secondary | ICD-10-CM | POA: Diagnosis not present

## 2022-02-10 DIAGNOSIS — R296 Repeated falls: Secondary | ICD-10-CM | POA: Diagnosis not present

## 2022-02-19 ENCOUNTER — Encounter: Payer: Self-pay | Admitting: Podiatry

## 2022-02-19 ENCOUNTER — Ambulatory Visit (INDEPENDENT_AMBULATORY_CARE_PROVIDER_SITE_OTHER): Payer: Medicare PPO

## 2022-02-19 ENCOUNTER — Ambulatory Visit: Payer: Medicare PPO | Admitting: Podiatry

## 2022-02-19 DIAGNOSIS — B070 Plantar wart: Secondary | ICD-10-CM

## 2022-02-19 DIAGNOSIS — M7752 Other enthesopathy of left foot: Secondary | ICD-10-CM

## 2022-02-19 DIAGNOSIS — M779 Enthesopathy, unspecified: Secondary | ICD-10-CM

## 2022-02-19 DIAGNOSIS — Q828 Other specified congenital malformations of skin: Secondary | ICD-10-CM

## 2022-02-19 NOTE — Progress Notes (Signed)
Subjective:   Patient ID: Cheryl Melendez, female   DOB: 71 y.o.   MRN: [id number]   HPI Patient presents with caregiver with painful lesion distal right foot that makes it hard at times to walk.  Does not remember injury patient does not smoke likes to be active   Review of Systems  All other systems reviewed and are negative.       Objective:  Physical Exam Vitals and nursing note reviewed.  Constitutional:      Appearance: She is well-developed.  Pulmonary:     Effort: Pulmonary effort is normal.  Musculoskeletal:        General: Normal range of motion.  Skin:    General: Skin is warm.  Neurological:     Mental Status: She is alert.     Neurovascular status intact muscle strength adequate range of motion adequate patient found to have keratotic lesions of the second third metatarsal right and appears to be slightly distal with pain upon direct palpation to the area.  Good digital perfusion well-oriented     Assessment:  Keratotic lesion with pressure of the metatarsal right with possibility of porokeratotic or verrucous lesion     Plan:  H&P x-ray reviewed today I did sharp sterile debridement of the lesion no iatrogenic bleeding applied dressing advised to return when symptoms indicate.  X-rays dated today do indicate it is distal to the second and third metatarsal shaft right with no other pathology noted

## 2022-03-03 ENCOUNTER — Ambulatory Visit: Payer: Medicare PPO | Admitting: Diagnostic Neuroimaging

## 2022-03-05 DIAGNOSIS — S5702XA Crushing injury of left elbow, initial encounter: Secondary | ICD-10-CM | POA: Diagnosis not present

## 2022-03-05 DIAGNOSIS — L03114 Cellulitis of left upper limb: Secondary | ICD-10-CM | POA: Diagnosis not present

## 2022-03-05 DIAGNOSIS — S51002A Unspecified open wound of left elbow, initial encounter: Secondary | ICD-10-CM | POA: Diagnosis not present

## 2022-03-05 DIAGNOSIS — S59902A Unspecified injury of left elbow, initial encounter: Secondary | ICD-10-CM | POA: Diagnosis not present

## 2022-04-30 ENCOUNTER — Other Ambulatory Visit: Payer: Self-pay | Admitting: Internal Medicine

## 2022-04-30 ENCOUNTER — Ambulatory Visit
Admission: RE | Admit: 2022-04-30 | Discharge: 2022-04-30 | Disposition: A | Discharge status: Home / Self Care | Payer: Medicare PPO | Source: Ambulatory Visit | Attending: Internal Medicine | Admitting: Internal Medicine

## 2022-04-30 DIAGNOSIS — R42 Dizziness and giddiness: Secondary | ICD-10-CM | POA: Diagnosis not present

## 2022-04-30 DIAGNOSIS — M25522 Pain in left elbow: Secondary | ICD-10-CM

## 2022-04-30 DIAGNOSIS — G259 Extrapyramidal and movement disorder, unspecified: Secondary | ICD-10-CM | POA: Diagnosis not present

## 2022-04-30 DIAGNOSIS — Z8352 Family history of ear disorders: Secondary | ICD-10-CM | POA: Diagnosis not present

## 2022-04-30 DIAGNOSIS — F331 Major depressive disorder, recurrent, moderate: Secondary | ICD-10-CM | POA: Diagnosis not present

## 2022-04-30 DIAGNOSIS — S51002D Unspecified open wound of left elbow, subsequent encounter: Secondary | ICD-10-CM | POA: Diagnosis not present

## 2022-04-30 DIAGNOSIS — R296 Repeated falls: Secondary | ICD-10-CM | POA: Diagnosis not present

## 2022-04-30 DIAGNOSIS — R030 Elevated blood-pressure reading, without diagnosis of hypertension: Secondary | ICD-10-CM | POA: Diagnosis not present

## 2022-04-30 DIAGNOSIS — F419 Anxiety disorder, unspecified: Secondary | ICD-10-CM | POA: Diagnosis not present

## 2022-05-19 DIAGNOSIS — R42 Dizziness and giddiness: Secondary | ICD-10-CM | POA: Diagnosis not present

## 2022-05-19 DIAGNOSIS — R2681 Unsteadiness on feet: Secondary | ICD-10-CM | POA: Diagnosis not present

## 2022-06-03 DIAGNOSIS — L03114 Cellulitis of left upper limb: Secondary | ICD-10-CM | POA: Diagnosis not present

## 2022-06-03 DIAGNOSIS — S51002D Unspecified open wound of left elbow, subsequent encounter: Secondary | ICD-10-CM | POA: Diagnosis not present

## 2022-06-06 ENCOUNTER — Encounter: Payer: Self-pay | Admitting: Podiatry

## 2022-06-06 ENCOUNTER — Ambulatory Visit: Payer: Medicare PPO | Admitting: Podiatry

## 2022-06-06 DIAGNOSIS — M7751 Other enthesopathy of right foot: Secondary | ICD-10-CM

## 2022-06-06 DIAGNOSIS — B070 Plantar wart: Secondary | ICD-10-CM | POA: Diagnosis not present

## 2022-06-06 MED ORDER — TRIAMCINOLONE ACETONIDE 10 MG/ML IJ SUSP
10.0000 mg | Freq: Once | INTRAMUSCULAR | Status: AC
  Administered 2022-06-06: 10 mg

## 2022-06-06 NOTE — Progress Notes (Signed)
Subjective:   Patient ID: Cheryl Melendez, female   DOB: 72 y.o.   MRN: [id number]   HPI Patient presents with a lesion which continues to bother her [facility] growth probe in the midfoot area are also has inflammation around the second metatarsal phalangeal joint which is sore with patient at times falling due to pain   ROS      Objective:  Physical Exam  Neurovascular status intact inflammation around the second MPJ right with a proximal small lesion measuring 5 x 5 mm that does have a small amount of pinpoint bleeding upon debridement     Assessment:  Inflammatory capsulitis second MPJ right with lesion right which may be verruca plantaris or other unknown pathology     Plan:  H&P reviewed both conditions sterile sharp debridement of lesion accomplished and applied chemical agent to create immune response with sterile dressing then went more distal and for the dorsal direction directed to the area did periarticular injection 3 mg Dexasone Kenalog 5 mg Xylocaine around the second MPJ

## 2022-06-16 ENCOUNTER — Encounter: Payer: Medicare PPO | Attending: Physician Assistant | Admitting: Physician Assistant

## 2022-06-16 DIAGNOSIS — W19XXXA Unspecified fall, initial encounter: Secondary | ICD-10-CM | POA: Insufficient documentation

## 2022-06-16 DIAGNOSIS — I10 Essential (primary) hypertension: Secondary | ICD-10-CM | POA: Diagnosis not present

## 2022-06-16 DIAGNOSIS — S51012A Laceration without foreign body of left elbow, initial encounter: Secondary | ICD-10-CM | POA: Diagnosis not present

## 2022-06-16 DIAGNOSIS — L98492 Non-pressure chronic ulcer of skin of other sites with fat layer exposed: Secondary | ICD-10-CM | POA: Diagnosis not present

## 2022-06-16 DIAGNOSIS — F4312 Post-traumatic stress disorder, chronic: Secondary | ICD-10-CM | POA: Insufficient documentation

## 2022-06-16 DIAGNOSIS — L988 Other specified disorders of the skin and subcutaneous tissue: Secondary | ICD-10-CM | POA: Diagnosis not present

## 2022-06-17 DIAGNOSIS — S51002A Unspecified open wound of left elbow, initial encounter: Secondary | ICD-10-CM | POA: Diagnosis not present

## 2022-06-17 NOTE — Progress Notes (Signed)
[name], HUGHETT Melendez ([id number]) [id number] Page 1 of 8 Visit Report for 06/16/2022 Chief Complaint Document Details Patient Name: Date of Service: Cheryl Melendez, Cheryl Melendez 06/16/2022 8:45 A M Medical Record Number: [id number] Patient Account Number: [id number] Date of Birth/Sex: Treating RN: Sep 22, 1950 (72 y.o. Female) Carlene Coria Primary Care Provider: Early Osmond Other Clinician: Referring Provider: Treating Provider/Extender: Norlene Duel, Rinka Weeks in Treatment: 0 Information Obtained from: Patient Chief Complaint Left elbow laceration/skin tear Electronic Signature(s) Signed: 06/16/2022 9:25:28 AM By: Worthy Keeler PA-C Entered By: Worthy Keeler on 06/16/2022 [id number] -------------------------------------------------------------------------------- Debridement Details Patient Name: Date of Service: Cheryl Coss Melendez. 06/16/2022 8:45 A M Medical Record Number: [id number] Patient Account Number: [id number] Date of Birth/Sex: Treating RN: 09/11/1950 (72 y.o. Female) Carlene Coria Primary Care Provider: Early Osmond Other Clinician: Referring Provider: Treating Provider/Extender: Norlene Duel, Rinka Weeks in Treatment: 0 Debridement Performed for Assessment: Wound #1 Left Elbow Performed By: Physician Tommie Sams., PA-C Debridement Type: Chemical/Enzymatic/Mechanical Agent Used: saline gauze Level of Consciousness (Pre-procedure): Awake and Alert Pre-procedure Verification/Time Out No Taken: Start Time: 09:30 Instrument: Other : saline gauze Bleeding: Moderate Hemostasis Achieved: Pressure End Time: 09:32 Procedural Pain: 0 Post Procedural Pain: 0 Response to Treatment: Procedure was tolerated well Level of Consciousness (Post- Awake and Alert procedure): Post Debridement Measurements of Total Wound KOLETTE, NEARING ([id number]) [id number] Page 2 of 8 Length: (cm) 0.9 Width: (cm) 0.5 Depth: (cm)  0.6 Volume: (cm) 0.212 Character of Wound/Ulcer Post Debridement: Improved Post Procedure Diagnosis Same as Pre-procedure Electronic Signature(s) Signed: 06/16/2022 4:28:51 PM By: Carlene Coria RN Signed: 06/16/2022 5:34:14 PM By: Worthy Keeler PA-C Entered By: Carlene Coria on 06/16/2022 09:34:07 -------------------------------------------------------------------------------- HPI Details Patient Name: Date of Service: Cheryl Coss Melendez. 06/16/2022 8:45 A M Medical Record Number: [id number] Patient Account Number: [id number] Date of Birth/Sex: Treating RN: 1950-09-04 (72 y.o. Female) Carlene Coria Primary Care Provider: Early Osmond Other Clinician: Referring Provider: Treating Provider/Extender: Norlene Duel, Rinka Weeks in Treatment: 0 History of Present Illness HPI Description: 06-16-2022 upon evaluation today patient presents today for an initial evaluation in our clinic concerning a left elbow injury which occurred actually on or around the beginning of January 2024. She actually had an x-ray which was negative for fracture on 05-02-2022. Subsequently however she has had an open wound since that time and there has been some speak of infection. She was actually on Augmentin and Cipro which was completed 2 weeks ago. She has been doing wet-to-dry dressings at this point. With that being said the wound is still very open and she continues to have falls which unfortunately is not helping either. I do not see any signs of active infection at this time which is great news I do not think she requires any antibiotics as of today. The patient has a history mainly of just posttraumatic stress disorder which unfortunately is quite severe and is affecting her in all manners as far as her walking is concerned she also has spasms and has very Parkinson-like symptoms which are all attributed to the PTSD. With that being said this also causes her to fall at times which is where this injury came  from and she has had other injuries where she is fallen and hurt herself as well. We discussed the possibility of using elbow protectors such as skaters would use in order to pad the area over the elbow I think this could actually be of benefit for her. Electronic Signature(s) Signed: 06/16/2022 10:21:35 AM  By: Worthy Keeler PA-C Entered By: Worthy Keeler on 06/16/2022 10:21:35 -------------------------------------------------------------------------------- Physical Exam Details Patient Name: Date of Service: [name], Melendez 06/16/2022 8:45 A M Medical Record Number: [id number] Patient Account Number: [id number] Date of Birth/Sex: Treating RN: 27-Jun-1950 (72 y.o. Female) Carlene Coria Primary Care Provider: Early Osmond Other Clinician: ARLEEN, DICARLO ([id number]) [id number] Page 3 of 8 Referring Provider: Treating Provider/Extender: Norlene Duel, Rinka Weeks in Treatment: 0 Constitutional patient is hypertensive.. pulse regular and within target range for patient.Marland Kitchen respirations regular, non-labored and within target range for patient.Marland Kitchen temperature within target range for patient.. Well-nourished and well-hydrated in no acute distress. Eyes conjunctiva clear no eyelid edema noted. pupils equal round and reactive to light and accommodation. Ears, Nose, Mouth, and Throat no gross abnormality of ear auricles or external auditory canals. normal hearing noted during conversation. mucus membranes moist. Respiratory normal breathing without difficulty. Musculoskeletal unsteady while walking. no significant deformity or arthritic changes, no loss or range of motion, no clubbing. Psychiatric this patient is able to make decisions and demonstrates good insight into disease process. Alert and Oriented x 3. pleasant and cooperative. Notes Upon inspection patient's wound bed actually showed signs of good granulation and some areas although she actually had  evidence of muscle exposed in the base of the wound unfortunately. This is quite significant and to be honest I do believe that being that this is directly over the elbow I am not certain that we will get to have great chance of getting this to heal as the granulation tissue generally does not attach very well to this tissue directly over the olecranon process. Electronic Signature(s) Signed: 06/16/2022 10:22:44 AM By: Worthy Keeler PA-C Entered By: Worthy Keeler on 06/16/2022 10:22:43 -------------------------------------------------------------------------------- Physician Orders Details Patient Name: Date of Service: Cheryl Coss Melendez. 06/16/2022 8:45 A M Medical Record Number: [id number] Patient Account Number: [id number] Date of Birth/Sex: Treating RN: 03-03-1951 (72 y.o. Female) Carlene Coria Primary Care Provider: Early Osmond Other Clinician: Referring Provider: Treating Provider/Extender: Norlene Duel, Rinka Weeks in Treatment: 0 Verbal / Phone Orders: No Diagnosis Coding ICD-10 Coding Code Description [id number] Laceration without foreign body of left elbow, initial encounter F43.12 Post-traumatic stress disorder, chronic Follow-up Appointments Return Appointment in 1 week. Bathing/ [employer] wounds with antibacterial soap and water. Anesthetic (Use 'Patient Medications' Section for Anesthetic Order Entry) Lidocaine applied to wound bed Wound Treatment Wound #1 - Elbow Wound Laterality: Left Cleanser: Byram Ancillary Kit - 15 Day Supply (DME) (Generic) 3 x Per Week/30 Days Discharge Instructions: Use supplies as instructed; Kit contains: (15) Saline Bullets; (15) 3x3 Gauze; [address] NAAIRAH, CROWE Melendez ([id number]) [id number] Page 4 of 8 Cleanser: Soap and Water 3 x Per Week/30 Days Discharge Instructions: Gently cleanse wound with antibacterial soap, rinse and pat dry prior to dressing wounds Cleanser: Wound Cleanser 3 x Per  Week/30 Days Discharge Instructions: Wash your hands with soap and water. Remove old dressing, discard into plastic bag and place into trash. Cleanse the wound with Wound Cleanser prior to applying a clean dressing using gauze sponges, not tissues or cotton balls. Do not scrub or use excessive force. Pat dry using gauze sponges, not tissue or cotton balls. Prim Dressing: Prisma 4.34 (in) 3 x Per Week/30 Days ary Discharge Instructions: Moisten w/normal saline or sterile water; Cover wound as directed. Do not remove from wound bed. Secondary Dressing: (BORDER) Zetuvit Plus SILICONE BORDER Dressing 4x4 (in/in) (DME) (Generic) 3 x Per Week/30 Days  Discharge Instructions: Please do not put silicone bordered dressings under wraps. Use non-bordered dressing only. Consults Plastic Surgery - surgical consult for closure - (ICD10 S51.012A - Laceration without foreign body of left elbow, initial encounter) Electronic Signature(s) Signed: 06/16/2022 9:57:12 AM By: Carlene Coria RN Signed: 06/16/2022 5:34:14 PM By: Worthy Keeler PA-C Entered By: Carlene Coria on 06/16/2022 09:57:12 -------------------------------------------------------------------------------- Problem List Details Patient Name: Date of Service: Cheryl Coss Melendez. 06/16/2022 8:45 A M Medical Record Number: [id number] Patient Account Number: [id number] Date of Birth/Sex: Treating RN: 08/04/1950 (72 y.o. Female) Carlene Coria Primary Care Provider: Early Osmond Other Clinician: Referring Provider: Treating Provider/Extender: Norlene Duel, Rinka Weeks in Treatment: 0 Active Problems ICD-10 Encounter Code Description Active Date MDM Diagnosis S51.012A Laceration without foreign body of left elbow, initial encounter 06/16/2022 No Yes F43.12 Post-traumatic stress disorder, chronic 06/16/2022 No Yes Inactive Problems Resolved Problems Electronic Signature(s) Signed: 06/16/2022 9:24:50 AM By: Worthy Keeler PA-C Entered By: Worthy Keeler on 06/16/2022 09:24:50 Cassandria Santee Melendez ([id number] Page 5 of 8 -------------------------------------------------------------------------------- Progress Note Details Patient Name: Date of Service: KALEB, DIMEGLIO 06/16/2022 8:45 A M Medical Record Number: [id number] Patient Account Number: [id number] Date of Birth/Sex: Treating RN: 05-17-1950 (72 y.o. Female) Carlene Coria Primary Care Provider: Early Osmond Other Clinician: Referring Provider: Treating Provider/Extender: Norlene Duel, Rinka Weeks in Treatment: 0 Subjective Chief Complaint Information obtained from Patient Left elbow laceration/skin tear History of Present Illness (HPI) 06-16-2022 upon evaluation today patient presents today for an initial evaluation in our clinic concerning a left elbow injury which occurred actually on or around the beginning of January 2024. She actually had an x-ray which was negative for fracture on 05-02-2022. Subsequently however she has had an open wound since that time and there has been some speak of infection. She was actually on Augmentin and Cipro which was completed 2 weeks ago. She has been doing wet-to-dry dressings at this point. With that being said the wound is still very open and she continues to have falls which unfortunately is not helping either. I do not see any signs of active infection at this time which is great news I do not think she requires any antibiotics as of today. The patient has a history mainly of just posttraumatic stress disorder which unfortunately is quite severe and is affecting her in all manners as far as her walking is concerned she also has spasms and has very Parkinson-like symptoms which are all attributed to the PTSD. With that being said this also causes her to fall at times which is where this injury came from and she has had other injuries where she is fallen and hurt herself as well. We discussed  the possibility of using elbow protectors such as skaters would use in order to pad the area over the elbow I think this could actually be of benefit for her. Patient History Allergies Sulfa (Sulfonamide Antibiotics), bee venom protein (honey bee) Social History Never smoker, Marital Status - Married, Alcohol Use - Never, Drug Use - No History, Caffeine Use - Daily. Objective Constitutional patient is hypertensive.. pulse regular and within target range for patient.Marland Kitchen respirations regular, non-labored and within target range for patient.Marland Kitchen temperature within target range for patient.. Well-nourished and well-hydrated in no acute distress. Vitals Time Taken: 8:58 AM, Height: 66 in, Source: Stated, Weight: 150 lbs, Source: Stated, BMI: 24.2, Temperature: 98.5 F, Pulse: 121 bpm, Respiratory Rate: 18 breaths/min, Blood Pressure: 185/63 mmHg. Eyes conjunctiva clear no eyelid  edema noted. pupils equal round and reactive to light and accommodation. Ears, Nose, Mouth, and Throat no gross abnormality of ear auricles or external auditory canals. normal hearing noted during conversation. mucus membranes moist. Respiratory normal breathing without difficulty. Musculoskeletal unsteady while walking. no significant deformity or arthritic changes, no loss or range of motion, no clubbing. Psychiatric this patient is able to make decisions and demonstrates good insight into disease process. Alert and Oriented x 3. pleasant and cooperative. TAWNNI, BESTER Melendez ([id number]) [id number] Page 6 of 8 General Notes: Upon inspection patient's wound bed actually showed signs of good granulation and some areas although she actually had evidence of muscle exposed in the base of the wound unfortunately. This is quite significant and to be honest I do believe that being that this is directly over the elbow I am not certain that we will get to have great chance of getting this to heal as the  granulation tissue generally does not attach very well to this tissue directly over the olecranon process. Integumentary (Hair, Skin) Wound #1 status is Open. Original cause of wound was Trauma. The date acquired was: 04/21/2022. The wound is located on the Left Elbow. The wound measures 0.9cm length x 0.5cm width x 0.6cm depth; 0.353cm^2 area and 0.212cm^3 volume. There is Fat Layer (Subcutaneous Tissue) exposed. There is undermining starting at 12:00 and ending at 12:00 with a maximum distance of 3cm. There is a medium amount of serosanguineous drainage noted. There is large (67-100%) red granulation within the wound bed. There is a small (1-33%) amount of necrotic tissue within the wound bed including Adherent Slough. Assessment Active Problems ICD-10 Laceration without foreign body of left elbow, initial encounter Post-traumatic stress disorder, chronic Procedures Wound #1 Pre-procedure diagnosis of Wound #1 is a Lesion located on the Left Elbow . There was a Chemical/Enzymatic/Mechanical debridement performed by Tommie Sams., PA-C. With the following instrument(s): saline gauze. Other agent used was saline gauze. A Moderate amount of bleeding was controlled with Pressure. The procedure was tolerated well with a pain level of 0 throughout and a pain level of 0 following the procedure. Post Debridement Measurements: 0.9cm length x 0.5cm width x 0.6cm depth; 0.212cm^3 volume. Character of Wound/Ulcer Post Debridement is improved. Post procedure Diagnosis Wound #1: Same as Pre-Procedure Plan Follow-up Appointments: Return Appointment in 1 week. Bathing/ Shower/ Hygiene: Wash wounds with antibacterial soap and water. Anesthetic (Use 'Patient Medications' Section for Anesthetic Order Entry): Lidocaine applied to wound bed Consults ordered were: Plastic Surgery - surgical consult for closure WOUND #1: - Elbow Wound Laterality: Left Cleanser: Byram Ancillary Kit - 15 Day Supply (DME)  (Generic) 3 x Per Week/30 Days Discharge Instructions: Use supplies as instructed; Kit contains: (15) Saline Bullets; (15) 3x3 Gauze; 15 pr Gloves Cleanser: Soap and Water 3 x Per Week/30 Days Discharge Instructions: Gently cleanse wound with antibacterial soap, rinse and pat dry prior to dressing wounds Cleanser: Wound Cleanser 3 x Per Week/30 Days Discharge Instructions: Wash your hands with soap and water. Remove old dressing, discard into plastic bag and place into trash. Cleanse the wound with Wound Cleanser prior to applying a clean dressing using gauze sponges, not tissues or cotton balls. Do not scrub or use excessive force. Pat dry using gauze sponges, not tissue or cotton balls. Prim Dressing: Prisma 4.34 (in) 3 x Per Week/30 Days ary Discharge Instructions: Moisten w/normal saline or sterile water; Cover wound as directed. Do not remove from wound bed. Secondary Dressing: (BORDER) Zetuvit Plus SILICONE BORDER  Dressing 4x4 (in/in) (DME) (Generic) 3 x Per Week/30 Days Discharge Instructions: Please do not put silicone bordered dressings under wraps. Use non-bordered dressing only. 1. Based on what I am seeing I do believe that the patient would benefit currently from going ahead and initiating treatment with silver collagen followed by bordered foam dressing I think this can be packed into the regions of undermining and it is her best chance of being able to heal if there is a chance of getting this to close short of a surgical intervention. 2. With that being said I am really not certain that there is a chance of this healing without surgical intervention in fact I think that would probably be her best bet to try to keep this from becoming infected and getting closure as quickly as possible. For that reason we will get a go ahead and make a referral to plastic surgery this will be Dr. Vernona Rieger or Dr. Jaclynn Guarneri in [place] at [place]. Both are excellent and I think either would be a good  option here if indeed plastic surgery is deemed the best way to go. 3. I am also going to suggest that the patient should continue to monitor for any signs of infection or worsening. Obviously based on what I see right now I do not think infection is a big deal or currently active but I think we need to keep a close eye on this. Will continue to see her weekly until she gets that appointment with the physicians at [facility]. We will see patient back for reevaluation in 1 week here in the clinic. If anything worsens or changes patient will contact our office for additional recommendations. Electronic Signature(s) Signed: 06/16/2022 10:23:59 AM By: Alphia Moh, [place] Melendez ([id number]) [id number] Page 7 of 8 Entered By: Worthy Keeler on 06/16/2022 10:23:59 -------------------------------------------------------------------------------- ROS/PFSH Details Patient Name: Date of Service: SHARONLEE, KUNDU 06/16/2022 8:45 A M Medical Record Number: [id number] Patient Account Number: [id number] Date of Birth/Sex: Treating RN: 1950-08-03 (72 y.o. Female) Carlene Coria Primary Care Provider: Early Osmond Other Clinician: Referring Provider: Treating Provider/Extender: Norlene Duel, Rinka Weeks in Treatment: 0 Immunizations Pneumococcal Vaccine: Received Pneumococcal Vaccination: No Implantable Devices None Family and Social History Never smoker; Marital Status - Married; Alcohol Use: Never; Drug Use: No History; Caffeine Use: Daily Electronic Signature(s) Signed: 06/16/2022 4:28:51 PM By: Carlene Coria RN Signed: 06/16/2022 5:34:14 PM By: Worthy Keeler PA-C Entered By: Carlene Coria on 06/16/2022 09:03:12 -------------------------------------------------------------------------------- SuperBill Details Patient Name: Date of Service: Cheryl Coss Melendez. 06/16/2022 Medical Record Number: [id number] Patient Account Number: [id number] Date of Birth/Sex:  Treating RN: 14-Oct-1950 (71 y.o. Female) Carlene Coria Primary Care Provider: Early Osmond Other Clinician: Referring Provider: Treating Provider/Extender: Norlene Duel, Rinka Weeks in Treatment: 0 Diagnosis Coding ICD-10 Codes Code Description [id number] Laceration without foreign body of left elbow, initial encounter F43.12 Post-traumatic stress disorder, chronic Facility Procedures : KENNA, TASSINARI Code: [id number] Natasha Mead ([id number]) Description: Emanuel VISIT-LEV 4 EST PT [id number] Modifier: [id number] Quantity: 1 [id number] Page 8 of 8 Physician Procedures : CPT4 Code Description Modifier [id number] - WC PHYS LEVEL 4 - NEW PT ICD-10 Diagnosis Description [id number] Laceration without foreign body of left elbow, initial encounter F43.12 Post-traumatic stress disorder, chronic Quantity: 1 Electronic Signature(s) Signed: 06/16/2022 10:25:22 AM By: Worthy Keeler PA-C Entered By: Worthy Keeler on 06/16/2022 10:25:22

## 2022-06-17 NOTE — Progress Notes (Signed)
TRENDA, CHICA Melendez ([id number]) [id number] Page 1 of 5 Visit Report for 06/16/2022 Abuse Risk Screen Details Patient Name: Date of Service: Cheryl Melendez, Cheryl Melendez 06/16/2022 8:45 A M Medical Record Number: [id number] Patient Account Number: [id number] Date of Birth/Sex: Treating RN: [birth year] (72 y.o. Female) Carlene Coria Primary Care Marcelus Dubberly: Early Osmond Other Clinician: Referring Vergia Chea: Treating Charisma Charlot/Extender: Norlene Duel, Rinka Weeks in Treatment: 0 Abuse Risk Screen Items Answer ABUSE RISK SCREEN: Has anyone close to you tried to hurt or harm you recentlyo No Do you feel uncomfortable with anyone in your familyo No Has anyone forced you do things that you didnt want to doo No Electronic Signature(s) Signed: 06/16/2022 4:28:51 PM By: Carlene Coria RN Entered By: Carlene Coria on 06/16/2022 09:03:18 -------------------------------------------------------------------------------- Activities of Daily Living Details Patient Name: Date of Service: Cheryl Melendez, Cheryl Melendez 06/16/2022 8:45 A M Medical Record Number: [id number] Patient Account Number: [id number] Date of Birth/Sex: Treating RN: 1950/06/28 (72 y.o. Female) Carlene Coria Primary Care Laxmi Choung: Early Osmond Other Clinician: Referring Kesley Mullens: Treating Shakeema Lippman/Extender: Norlene Duel, Rinka Weeks in Treatment: 0 Activities of Daily Living Items Answer Activities of Daily Living (Please select one for each item) Drive Automobile Not Able T Medications ake Completely Able Use T elephone Completely Able Care for Appearance Completely Able Use T oilet Completely Able Bath / Shower Completely Able Dress Self Completely Able Feed Self Completely Able Walk Completely Able Get In / Out Bed Completely Able [address] Cheryl Melendez, Cheryl Melendez ([id number]) [id number] Page 2 of 5 Prepare Meals Need Assistance Handle Money Need  Assistance Shop for Self Need Assistance Electronic Signature(s) Signed: 06/16/2022 4:28:51 PM By: Carlene Coria RN Entered By: Carlene Coria on 06/16/2022 09:03:55 -------------------------------------------------------------------------------- Education Screening Details Patient Name: Date of Service: Cheryl Coss Melendez. 06/16/2022 8:45 A M Medical Record Number: [id number] Patient Account Number: [id number] Date of Birth/Sex: Treating RN: 1950/11/28 (72 y.o. Female) Carlene Coria Primary Care Ameenah Prosser: Early Osmond Other Clinician: Referring Arwyn Besaw: Treating Ardra Kuznicki/Extender: Norlene Duel, Rinka Weeks in Treatment: 0 Primary Learner Assessed: Patient Learning Preferences/Education Level/Primary Language Learning Preference: Explanation Highest Education Level: College or Above Preferred Language: English Cognitive Barrier Language Barrier: No Translator Needed: No Memory Deficit: No Emotional Barrier: No Cultural/Religious Beliefs Affecting Medical Care: No Physical Barrier Impaired Vision: Yes Glasses Impaired Hearing: No Decreased Hand dexterity: No Knowledge/Comprehension Knowledge Level: High Comprehension Level: High Ability to understand written instructions: High Ability to understand verbal instructions: High Motivation Anxiety Level: Anxious Cooperation: Cooperative Education Importance: Acknowledges Need Interest in Health Problems: Asks Questions Perception: Coherent Willingness to Engage in Self-Management High Activities: Readiness to Engage in Self-Management High Activities: Electronic Signature(s) Signed: 06/16/2022 4:28:51 PM By: Carlene Coria RN Entered By: Carlene Coria on 06/16/2022 09:04:27 Cheryl Melendez ([id number] Page 3 of 5 -------------------------------------------------------------------------------- Fall Risk Assessment Details Patient Name: Date of Service: Cheryl Melendez, Cheryl Melendez  06/16/2022 8:45 A M Medical Record Number: [id number] Patient Account Number: [id number] Date of Birth/Sex: Treating RN: Apr 28, 1950 (72 y.o. Female) Carlene Coria Primary Care Kishawn Pickar: Early Osmond Other Clinician: Referring Anshika Pethtel: Treating Lenell Mcconnell/Extender: Norlene Duel, Rinka Weeks in Treatment: 0 Fall Risk Assessment Items Have you had 2 or more falls in the last 12 monthso 0 Yes Have you had any fall that resulted in injury in the last 12 monthso 0 Yes FALLS RISK SCREEN History of falling - immediate or within 3 months 25 Yes Secondary diagnosis (Do you have 2 or more medical diagnoseso) 0 No Ambulatory aid None/bed  rest/wheelchair/nurse 0 No Crutches/cane/walker 15 Yes Furniture 0 No Intravenous therapy Access/Saline/Heparin Lock 0 No Gait/Transferring Normal/ bed rest/ wheelchair 0 Yes Weak (short steps with or without shuffle, stooped but able to lift head while walking, may seek 0 No support from furniture) Impaired (short steps with shuffle, may have difficulty arising from chair, head down, impaired 0 No balance) Mental Status Oriented to own ability 0 Yes Electronic Signature(s) Signed: 06/16/2022 4:28:51 PM By: Carlene Coria RN Entered By: Carlene Coria on 06/16/2022 09:05:04 -------------------------------------------------------------------------------- Foot Assessment Details Patient Name: Date of Service: Cheryl Coss Melendez. 06/16/2022 8:45 A M Medical Record Number: [id number] Patient Account Number: [id number] Date of Birth/Sex: Treating RN: [birth year] (72 y.o. Female) Carlene Coria Primary Care Vinnie Bobst: Early Osmond Other Clinician: Referring Noga Fogg: Treating Hermena Swint/Extender: Norlene Duel, Rinka Weeks in Treatment: 0 Foot Assessment Items 1 Cheryl Melendez, Cheryl Melendez ([id number]) [id number] Page 4 of 5 + = Sensation present, - = Sensation absent, C = Callus, U = Ulcer R = Redness, W = Warmth, M =  Maceration, PU = Pre-ulcerative lesion F = Fissure, S = Swelling, Melendez = Dryness Assessment Right: Left: Other Deformity: No No Prior Foot Ulcer: No No Prior Amputation: No No Charcot Joint: No No Ambulatory Status: Ambulatory Without Help Gait: Steady Electronic Signature(s) Signed: 06/16/2022 4:28:51 PM By: Carlene Coria RN Entered By: Carlene Coria on 06/16/2022 09:05:51 -------------------------------------------------------------------------------- Nutrition Risk Screening Details Patient Name: Date of Service: Cheryl Melendez, Cheryl Melendez 06/16/2022 8:45 A M Medical Record Number: [id number] Patient Account Number: [id number] Date of Birth/Sex: Treating RN: [birth year] (72 y.o. Female) Epps, Morey Hummingbird Primary Care Katrena Stehlin: Early Osmond Other Clinician: Referring Juliette Standre: Treating Jessee Newnam/Extender: Norlene Duel, Rinka Weeks in Treatment: 0 Height (in): 66 Weight (lbs): 150 Body Mass Index (BMI): 24.2 Nutrition Risk Screening Items Score Screening NUTRITION RISK SCREEN: I have an illness or condition that made me change the kind and/or amount of food I eat 0 No I eat fewer than two meals per day 0 No I eat few fruits and vegetables, or milk products 0 No I have three or more drinks of beer, liquor or wine almost every day 0 No I have tooth or mouth problems that make it hard for me to eat 0 No I don't always have enough money to buy the food I need 0 No Cheryl Melendez, Cheryl Melendez ([id number]) [phone] [id number] Page 5 of 5 I eat alone most of the time 0 No I take three or more different prescribed or over-the-counter drugs a day 1 Yes Without wanting to, I have lost or gained 10 pounds in the last six months 0 No I am not always physically able to shop, cook and/or feed myself 2 Yes Nutrition Protocols Good Risk Protocol Moderate Risk Protocol 0 Provide education on nutrition High Risk Proctocol Risk Level: Moderate Risk Score: 3 Electronic  Signature(s) Signed: 06/16/2022 4:28:51 PM By: Carlene Coria RN Entered By: Carlene Coria on 06/16/2022 09:05:40

## 2022-06-17 NOTE — Progress Notes (Signed)
TRANY, RADDEN Melendez ([id number]) [id number] Page 1 of 9 Visit Report for 06/16/2022 Allergy List Details Patient Name: Date of Service: Cheryl Melendez, Cheryl Melendez 06/16/2022 8:45 A M Medical Record Number: [id number] Patient Account Number: [id number] Date of Birth/Sex: Treating RN: 1950-07-28 (72 y.o. Female) Carlene Coria Primary Care Jaiveer Panas: Early Osmond Other Clinician: Referring Tamorah Hada: Treating Aylana Hirschfeld/Extender: Norlene Duel, Rinka Weeks in Treatment: 0 Allergies Active Allergies Sulfa (Sulfonamide Antibiotics) bee venom protein (honey bee) Allergy Notes Electronic Signature(s) Signed: 06/16/2022 4:28:51 PM By: Carlene Coria RN Entered By: Carlene Coria on 06/16/2022 09:01:45 -------------------------------------------------------------------------------- Arrival Information Details Patient Name: Date of Service: Cheryl Melendez. 06/16/2022 8:45 A M Medical Record Number: [id number] Patient Account Number: [id number] Date of Birth/Sex: Treating RN: 1951-03-12 (71 y.o. Female) Carlene Coria Primary Care Shane Badeaux: Early Osmond Other Clinician: Referring Berlynn Warsame: Treating Makensie Mulhall/Extender: Norlene Duel, Rinka Weeks in Treatment: 0 Visit Information Patient Arrived: Cane Arrival Time: 08:58 Accompanied By: husband Transfer Assistance: None Patient Identification Verified: Yes Secondary Verification Process Completed: Yes Patient Requires Transmission-Based Precautions: No Patient Has Alerts: No Electronic Signature(s) Signed: 06/16/2022 4:28:51 PM By: Carlene Coria RN Carlis Abbott, Cheryl Melendez ([id number]) [id number] Page 2 of 9 Entered By: Carlene Coria on 06/16/2022 08:58:42 -------------------------------------------------------------------------------- Clinic Level of Care Assessment Details Patient Name: Date of Service: Cheryl Melendez, Cheryl Melendez 06/16/2022 8:45 A M Medical Record Number: [id number] Patient Account Number:  [id number] Date of Birth/Sex: Treating RN: [birth year] (72 y.o. Female) Carlene Coria Primary Care Haytham Maher: Early Osmond Other Clinician: Referring Arvel Oquinn: Treating Memphis Decoteau/Extender: Norlene Duel, Rinka Weeks in Treatment: 0 Clinic Level of Care Assessment Items TOOL 2 Quantity Score X- 1 0 Use when only an EandM is performed on the INITIAL visit ASSESSMENTS - Nursing Assessment / Reassessment X- 1 20 General Physical Exam (combine w/ comprehensive assessment (listed just below) when performed on new pt. evals) X- 1 25 Comprehensive Assessment (HX, ROS, Risk Assessments, Wounds Hx, etc.) ASSESSMENTS - Wound and Skin A ssessment / Reassessment X - Simple Wound Assessment / Reassessment - one wound 1 5 '[]'$  - 0 Complex Wound Assessment / Reassessment - multiple wounds '[]'$  - 0 Dermatologic / Skin Assessment (not related to wound area) ASSESSMENTS - Ostomy and/or Continence Assessment and Care '[]'$  - 0 Incontinence Assessment and Management '[]'$  - 0 Ostomy Care Assessment and Management (repouching, etc.) PROCESS - Coordination of Care X - Simple Patient / Family Education for ongoing care 1 15 '[]'$  - 0 Complex (extensive) Patient / Family Education for ongoing care X- 1 10 Staff obtains Programmer, systems, Records, T Results / Process Orders est '[]'$  - 0 Staff telephones HHA, Nursing Homes / Clarify orders / etc '[]'$  - 0 Routine Transfer to another Facility (non-emergent condition) '[]'$  - 0 Routine Hospital Admission (non-emergent condition) X- 1 15 New Admissions / Biomedical engineer / Ordering NPWT Apligraf, etc. , '[]'$  - 0 Emergency Hospital Admission (emergent condition) X- 1 10 Simple Discharge Coordination '[]'$  - 0 Complex (extensive) Discharge Coordination PROCESS - Special Needs '[]'$  - 0 Pediatric / Minor Patient Management '[]'$  - 0 Isolation Patient Management '[]'$  - 0 Hearing / Language / Visual special needs '[]'$  - 0 Assessment of Community assistance (transportation, Melendez/C  planning, etc.) '[]'$  - 0 Additional assistance / Altered mentation '[]'$  - 0 Support Surface(s) Assessment (bed, cushion, seat, etc.) Cheryl Melendez, Cheryl Melendez ([id number]) [id number] Page 3 of 9 INTERVENTIONS - Wound Cleansing / Measurement X- 1 5 Wound Imaging (photographs - any number of wounds) '[]'$  - 0 Wound Tracing (instead of photographs) X-  1 5 Simple Wound Measurement - one wound '[]'$  - 0 Complex Wound Measurement - multiple wounds X- 1 5 Simple Wound Cleansing - one wound '[]'$  - 0 Complex Wound Cleansing - multiple wounds INTERVENTIONS - Wound Dressings X - Small Wound Dressing one or multiple wounds 1 10 '[]'$  - 0 Medium Wound Dressing one or multiple wounds '[]'$  - 0 Large Wound Dressing one or multiple wounds '[]'$  - 0 Application of Medications - injection INTERVENTIONS - Miscellaneous '[]'$  - 0 External ear exam '[]'$  - 0 Specimen Collection (cultures, biopsies, blood, body fluids, etc.) '[]'$  - 0 Specimen(s) / Culture(s) sent or taken to Lab for analysis '[]'$  - 0 Patient Transfer (multiple staff / Civil Service fast streamer / Similar devices) '[]'$  - 0 Simple Staple / Suture removal (25 or less) '[]'$  - 0 Complex Staple / Suture removal (26 or more) '[]'$  - 0 Hypo / Hyperglycemic Management (close monitor of Blood Glucose) '[]'$  - 0 Ankle / Brachial Index (ABI) - do not check if billed separately Has the patient been seen at the hospital within the last three years: Yes Total Score: 125 Level Of Care: New/Established - Level 4 Electronic Signature(s) Signed: 06/16/2022 4:28:51 PM By: Carlene Coria RN Entered By: Carlene Coria on 06/16/2022 09:31:30 -------------------------------------------------------------------------------- Encounter Discharge Information Details Patient Name: Date of Service: Cheryl Melendez. 06/16/2022 8:45 A M Medical Record Number: [id number] Patient Account Number: [id number] Date of Birth/Sex: Treating RN: Nov 13, 1950 (72 y.o. Female) Carlene Coria Primary Care  Candra Wegner: Early Osmond Other Clinician: Referring Aalani Aikens: Treating Mitch Arquette/Extender: Norlene Duel, Rinka Weeks in Treatment: 0 Encounter Discharge Information Items Post Procedure Vitals Discharge Condition: Stable Temperature (F): 98.5 Ambulatory Status: Ambulatory Pulse (bpm): 121 Discharge Destination: Home Respiratory Rate (breaths/min): 18 Transportation: Private Auto Blood Pressure (mmHg): 185/63 Accompanied By: husband Schedule Follow-up Appointment: Yes Clinical Summary of Care: BEATRICE, MIRE ([id number]) [phone].pdf Page 4 of 9 Electronic Signature(s) Signed: 06/16/2022 4:28:51 PM By: Carlene Coria RN Entered By: Carlene Coria on 06/16/2022 09:37:20 -------------------------------------------------------------------------------- Lower Extremity Assessment Details Patient Name: Date of Service: Cheryl Melendez, Cheryl Melendez 06/16/2022 8:45 A M Medical Record Number: [id number] Patient Account Number: [id number] Date of Birth/Sex: Treating RN: [birth year] (72 y.o. Female) Carlene Coria Primary Care Kenneth Lax: Early Osmond Other Clinician: Referring Lavaughn Bisig: Treating Pride Gonzales/Extender: Norlene Duel, Rinka Weeks in Treatment: 0 Electronic Signature(s) Signed: 06/16/2022 4:28:51 PM By: Carlene Coria RN Entered By: Carlene Coria on 06/16/2022 09:06:01 -------------------------------------------------------------------------------- Multi Wound Chart Details Patient Name: Date of Service: Cheryl Melendez. 06/16/2022 8:45 A M Medical Record Number: [id number] Patient Account Number: [id number] Date of Birth/Sex: Treating RN: 23-Jun-1950 (72 y.o. Female) Carlene Coria Primary Care Mesha Schamberger: Early Osmond Other Clinician: Referring Vishaal Strollo: Treating Josue Falconi/Extender: Norlene Duel, Rinka Weeks in Treatment: 0 Vital Signs Height(in): 66 Pulse(bpm): 121 Weight(lbs): 150 Blood Pressure(mmHg): 185/63 Body Mass Index(BMI):  24.2 Temperature(F): 98.5 Respiratory Rate(breaths/min): 18 [1:Photos:] [N/A:N/A] Left Elbow N/A N/A Wound Location: Trauma N/A N/A Wounding Event: LARICA, YOGI ([id number]) [phone].pdf Page 5 of 9 Lesion N/A N/A Primary Etiology: 04/21/2022 N/A N/A Date Acquired: 0 N/A N/A Weeks of Treatment: Open N/A N/A Wound Status: No N/A N/A Wound Recurrence: 0.9x0.5x0.6 N/A N/A Measurements L x W x Melendez (cm) 0.353 N/A N/A A (cm) : rea 0.212 N/A N/A Volume (cm) : 12 Starting Position 1 (o'clock): 12 Ending Position 1 (o'clock): 3 Maximum Distance 1 (cm): Yes N/A N/A Undermining: Full Thickness Without Exposed N/A N/A Classification: Support Structures Medium N/A N/A Exudate Amount: Serosanguineous N/A N/A Exudate Type:  red, brown N/A N/A Exudate Color: Large (67-100%) N/A N/A Granulation Amount: Red N/A N/A Granulation Quality: Small (1-33%) N/A N/A Necrotic Amount: Fat Layer (Subcutaneous Tissue): Yes N/A N/A Exposed Structures: Fascia: No Tendon: No Muscle: No Joint: No Bone: No None N/A N/A Epithelialization: Treatment Notes Electronic Signature(s) Signed: 06/16/2022 4:28:51 PM By: Carlene Coria RN Entered By: Carlene Coria on 06/16/2022 09:15:36 -------------------------------------------------------------------------------- Multi-Disciplinary Care Plan Details Patient Name: Date of Service: Cheryl Melendez. 06/16/2022 8:45 A M Medical Record Number: [id number] Patient Account Number: [id number] Date of Birth/Sex: Treating RN: 1950-05-12 (72 y.o. Female) Carlene Coria Primary Care Alexx Mcburney: Early Osmond Other Clinician: Referring Meryl Ponder: Treating Rosenda Geffrard/Extender: Norlene Duel, Rinka Weeks in Treatment: 0 Active Inactive Abuse / Safety / Falls / Self Care Management Nursing Diagnoses: Potential for injury related to falls Goals: Patient will remain injury free related to falls Date Initiated: 06/16/2022 Target  Resolution Date: 07/15/2022 Goal Status: Active Interventions: Assess Activities of Daily Living upon admission and as needed Assess fall risk on admission and as needed Assess: immobility, friction, shearing, incontinence upon admission and as needed Assess impairment of mobility on admission and as needed per policy Assess personal safety and home safety (as indicated) on admission and as needed Cheryl Melendez, Cheryl Melendez ([id number]) [phone].pdf Page 6 of 9 Assess self care needs on admission and as needed Notes: Necrotic Tissue Nursing Diagnoses: Knowledge deficit related to management of necrotic/devitalized tissue Goals: Necrotic/devitalized tissue will be minimized in the wound bed Date Initiated: 06/16/2022 Target Resolution Date: 07/15/2022 Goal Status: Active Interventions: Assess patient pain level pre-, during and post procedure and prior to discharge Provide education on necrotic tissue and debridement process Notes: Wound/Skin Impairment Nursing Diagnoses: Knowledge deficit related to ulceration/compromised skin integrity Goals: Patient/caregiver will verbalize understanding of skin care regimen Date Initiated: 06/16/2022 Target Resolution Date: 07/15/2022 Goal Status: Active Ulcer/skin breakdown will have a volume reduction of 30% by week 4 Date Initiated: 06/16/2022 Target Resolution Date: 07/15/2022 Goal Status: Active Ulcer/skin breakdown will have a volume reduction of 50% by week 8 Date Initiated: 06/16/2022 Target Resolution Date: 08/15/2022 Goal Status: Active Ulcer/skin breakdown will have a volume reduction of 80% by week 12 Date Initiated: 06/16/2022 Target Resolution Date: 09/14/2022 Goal Status: Active Ulcer/skin breakdown will heal within 14 weeks Date Initiated: 06/16/2022 Target Resolution Date: 10/15/2022 Goal Status: Active Interventions: Assess patient/caregiver ability to obtain necessary supplies Assess patient/caregiver ability to  perform ulcer/skin care regimen upon admission and as needed Assess ulceration(s) every visit Notes: Electronic Signature(s) Signed: 06/16/2022 4:28:51 PM By: Carlene Coria RN Entered By: Carlene Coria on 06/16/2022 09:17:59 -------------------------------------------------------------------------------- Pain Assessment Details Patient Name: Date of Service: Cheryl Melendez, Cheryl Melendez 06/16/2022 8:45 A M Medical Record Number: [id number] Patient Account Number: [id number] Date of Birth/Sex: Treating RN: 24-Dec-1950 (72 y.o. Female) Carlene Coria Primary Care Pauletta Pickney: Early Osmond Other Clinician: Referring Tamsyn Owusu: Treating Nadim Malia/Extender: Norlene Duel, Rinka Weeks in Treatment: [address] LYNDEL, BIELE Melendez ([id number]) [id number] Page 7 of 9 Active Problems Location of Pain Severity and Description of Pain Patient Has Paino No Site Locations Pain Management and Medication Current Pain Management: Electronic Signature(s) Signed: 06/16/2022 4:28:51 PM By: Carlene Coria RN Entered By: Carlene Coria on 06/16/2022 08:58:52 -------------------------------------------------------------------------------- Patient/Caregiver Education Details Patient Name: Date of Service: Darrick Penna 2/26/2024andnbsp8:45 [place] Record Number: [id number] Patient Account Number: [id number] Date of Birth/Gender: Treating RN: 12-Jun-1950 (72 y.o. Female) Carlene Coria Primary Care Physician: Early Osmond Other Clinician: Referring Physician: Treating Physician/Extender: Norlene Duel, Rinka Weeks in  Treatment: 0 Education Assessment Education Provided To: Patient Education Topics Provided Wound/Skin Impairment: Methods: Explain/Verbal Responses: State content correctly Electronic Signature(s) Signed: 06/16/2022 4:28:51 PM By: Carlene Coria RN Entered By: Carlene Coria on 06/16/2022 09:18:10 Cheryl Melendez ([id number].pdf Page 8 of  9 -------------------------------------------------------------------------------- Wound Assessment Details Patient Name: Date of Service: Cheryl Melendez, Cheryl Melendez 06/16/2022 8:45 A M Medical Record Number: [id number] Patient Account Number: [id number] Date of Birth/Sex: Treating RN: Oct 09, 1950 (72 y.o. Female) Epps, Morey Hummingbird Primary Care Edona Schreffler: Early Osmond Other Clinician: Referring Anniebelle Devore: Treating Catlynn Grondahl/Extender: Norlene Duel, Rinka Weeks in Treatment: 0 Wound Status Wound Number: 1 Primary Etiology: Lesion Wound Location: Left Elbow Wound Status: Open Wounding Event: Trauma Date Acquired: 04/21/2022 Weeks Of Treatment: 0 Clustered Wound: No Photos Wound Measurements Length: (cm) 0.9 Width: (cm) 0.5 Depth: (cm) 0.6 Area: (cm) 0.353 Volume: (cm) 0.212 % Reduction in Area: % Reduction in Volume: Epithelialization: None Undermining: Yes Starting Position (o'clock): 12 Ending Position (o'clock): 12 Maximum Distance: (cm) 3 Wound Description Classification: Full Thickness Without Exposed Suppor Exudate Amount: Medium Exudate Type: Serosanguineous Exudate Color: red, brown t Structures Foul Odor After Cleansing: No Slough/Fibrino Yes Wound Bed Granulation Amount: Large (67-100%) Exposed Structure Granulation Quality: Red Fascia Exposed: No Necrotic Amount: Small (1-33%) Fat Layer (Subcutaneous Tissue) Exposed: Yes Necrotic Quality: Adherent Slough Tendon Exposed: No Muscle Exposed: No Joint Exposed: No Bone Exposed: No Treatment Notes Wound #1 (Elbow) Wound Laterality: Left Cleanser Soap and [address] Thayer Melendez ([id number]) [id number] Page 9 of 9 Discharge Instruction: Gently cleanse wound with antibacterial soap, rinse and pat dry prior to dressing wounds Wound Cleanser Discharge Instruction: Wash your hands with soap and water. Remove old dressing, discard into plastic bag and place into trash. Cleanse the wound with  Wound Cleanser prior to applying a clean dressing using gauze sponges, not tissues or cotton balls. Do not scrub or use excessive force. Pat dry using gauze sponges, not tissue or cotton balls. Peri-Wound Care Topical Primary Dressing Prisma 4.34 (in) Discharge Instruction: Moisten w/normal saline or sterile water; Cover wound as directed. Do not remove from wound bed. Secondary Dressing (BORDER) Zetuvit Plus SILICONE BORDER Dressing 4x4 (in/in) Discharge Instruction: Please do not put silicone bordered dressings under wraps. Use non-bordered dressing only. Secured With Compression Wrap Compression Stockings Environmental education officer) Signed: 06/16/2022 4:28:51 PM By: Carlene Coria RN Entered By: Carlene Coria on 06/16/2022 09:14:29 -------------------------------------------------------------------------------- Vitals Details Patient Name: Date of Service: Cheryl Melendez. 06/16/2022 8:45 A M Medical Record Number: [id number] Patient Account Number: [id number] Date of Birth/Sex: Treating RN: 11-03-1950 (72 y.o. Female) Carlene Coria Primary Care Mayanna Garlitz: Early Osmond Other Clinician: Referring Cambry Spampinato: Treating Abie Cheek/Extender: Norlene Duel, Rinka Weeks in Treatment: 0 Vital Signs Time Taken: 08:58 Temperature (F): 98.5 Height (in): 66 Pulse (bpm): 121 Source: Stated Respiratory Rate (breaths/min): 18 Weight (lbs): 150 Blood Pressure (mmHg): 185/63 Source: Stated Reference Range: 80 - 120 mg / dl Body Mass Index (BMI): 24.2 Electronic Signature(s) Signed: 06/16/2022 4:28:51 PM By: Carlene Coria RN Entered By: Carlene Coria on 06/16/2022 08:59:54

## 2022-06-23 ENCOUNTER — Encounter: Payer: Medicare PPO | Attending: Internal Medicine | Admitting: Internal Medicine

## 2022-06-23 DIAGNOSIS — F4312 Post-traumatic stress disorder, chronic: Secondary | ICD-10-CM | POA: Insufficient documentation

## 2022-06-23 DIAGNOSIS — I10 Essential (primary) hypertension: Secondary | ICD-10-CM | POA: Diagnosis not present

## 2022-06-23 DIAGNOSIS — W19XXXA Unspecified fall, initial encounter: Secondary | ICD-10-CM | POA: Insufficient documentation

## 2022-06-23 DIAGNOSIS — S51012A Laceration without foreign body of left elbow, initial encounter: Secondary | ICD-10-CM | POA: Diagnosis not present

## 2022-06-27 NOTE — Progress Notes (Signed)
ARION, MATTOX ([id number]) [id number] Page 1 of 5 Visit Report for 06/23/2022 HPI Details Patient Name: Date of Service: Cheryl Melendez, Cheryl Melendez 06/23/2022 10:15 A M Medical Record Number: [id number] Patient Account Number: [id number] Date of Birth/Sex: Treating RN: 01/05/1951 (72 y.o. Cheryl Melendez Primary Care Provider: Early Osmond Other Clinician: Referring Provider: Treating Provider/Extender: RO BSO N, MICHA EL [place] Pho, Rinka Weeks in Treatment: 1 History of Present Illness HPI Description: 06-16-2022 upon evaluation today patient presents today for an initial evaluation in our clinic concerning a left elbow injury which occurred actually on or around the beginning of January 2024. She actually had an x-ray which was negative for fracture on 05-02-2022. Subsequently however she has had an open wound since that time and there has been some speak of infection. She was actually on Augmentin and Cipro which was completed 2 weeks ago. She has been doing wet-to-dry dressings at this point. With that being said the wound is still very open and she continues to have falls which unfortunately is not helping either. I do not see any signs of active infection at this time which is great news I do not think she requires any antibiotics as of today. The patient has a history mainly of just posttraumatic stress disorder which unfortunately is quite severe and is affecting her in all manners as far as her walking is concerned she also has spasms and has very Parkinson-like symptoms which are all attributed to the PTSD. With that being said this also causes her to fall at times which is where this injury came from and she has had other injuries where she is fallen and hurt herself as well. We discussed the possibility of using elbow protectors such as skaters would use in order to pad the area over the elbow I think this could actually be of benefit for her. 3/4; second visit for  this patient who suffered a traumatic wound to the left elbow. She received 2 different courses of antibiotics I am not sure of the benefit of the antibiotics in any case she is off these now. We have been using Prisma and sit to vet. The wound is actually quite deep but does not probe to bone Electronic Signature(s) Signed: 06/23/2022 4:38:26 PM By: Linton Ham MD Entered By: Linton Ham on 06/23/2022 10:59:13 -------------------------------------------------------------------------------- Physical Exam Details Patient Name: Date of Service: Cheryl Melendez. 06/23/2022 10:15 A M Medical Record Number: [id number] Patient Account Number: [id number] Date of Birth/Sex: Treating RN: 04/02/1951 (72 y.o. Cheryl Melendez Primary Care Provider: Early Osmond Other Clinician: Referring Provider: Treating Provider/Extender: RO BSO N, MICHA EL G Pahwani, Rinka Weeks in Treatment: 1 Constitutional Patient is hypertensive.Marland Kitchen Heart rate irregular.. Temperature is normal and within the target range for the patient.Marland Kitchen appears in no distress. Respiratory Respiratory effort is easy and symmetric bilaterally. Rate is normal at rest and on room air.Marland Kitchen Neurological . Cheryl Melendez ([id number]) [id number] Page 2 of 5 Notes Multiple bilateral wound exam; the wound has good granulation but deep relative depth. There is no obvious infection and does not probe to bone. Electronic Signature(s) Signed: 06/23/2022 4:38:26 PM By: Linton Ham MD Entered By: Linton Ham on 06/23/2022 11:00:38 -------------------------------------------------------------------------------- Physician Orders Details Patient Name: Date of Service: Cheryl Melendez. 06/23/2022 10:15 A M Medical Record Number: [id number] Patient Account Number: [id number] Date of Birth/Sex: Treating RN: May 18, 1950 (72 y.o. Cheryl Melendez Primary Care Provider: Early Osmond Other Clinician: Referring  Provider: Treating Provider/Extender: RO BSO  N, MICHA EL G Pahwani, Rinka Weeks in Treatment: 1 Verbal / Phone Orders: No Diagnosis Coding Follow-up Appointments Return Appointment in 1 week. Bathing/ [employer] wounds with antibacterial soap and water. Anesthetic (Use 'Patient Medications' Section for Anesthetic Order Entry) Lidocaine applied to wound bed Wound Treatment Wound #1 - Elbow Wound Laterality: Left Cleanser: Byram Ancillary Kit - 15 Day Supply (Generic) 3 x Per Week/30 Days Discharge Instructions: Use supplies as instructed; Kit contains: (15) Saline Bullets; (15) 3x3 Gauze; 15 pr Gloves Cleanser: Soap and Water 3 x Per Week/30 Days Discharge Instructions: Gently cleanse wound with antibacterial soap, rinse and pat dry prior to dressing wounds Cleanser: Wound Cleanser 3 x Per Week/30 Days Discharge Instructions: Wash your hands with soap and water. Remove old dressing, discard into plastic bag and place into trash. Cleanse the wound with Wound Cleanser prior to applying a clean dressing using gauze sponges, not tissues or cotton balls. Do not scrub or use excessive force. Pat dry using gauze sponges, not tissue or cotton balls. Prim Dressing: Prisma 4.34 (in) 3 x Per Week/30 Days ary Discharge Instructions: Moisten w/normal saline or sterile water; Cover wound as directed. Do not remove from wound bed. Secondary Dressing: (BORDER) Zetuvit Plus SILICONE BORDER Dressing 4x4 (in/in) (Generic) 3 x Per Week/30 Days Discharge Instructions: Please do not put silicone bordered dressings under wraps. Use non-bordered dressing only. Electronic Signature(s) Signed: 06/23/2022 4:38:26 PM By: Linton Ham MD Signed: 06/25/2022 3:12:30 PM By: Carlene Coria RN Entered By: Carlene Coria on 06/23/2022 10:51:19 Cheryl Melendez, Cheryl Melendez ([id number]) [id number] Page 3 of 5 -------------------------------------------------------------------------------- Problem List  Details Patient Name: Date of Service: Cheryl Melendez, Cheryl Melendez 06/23/2022 10:15 A M Medical Record Number: [id number] Patient Account Number: [id number] Date of Birth/Sex: Treating RN: Aug 08, 1950 (72 y.o. Cheryl Melendez Primary Care Provider: Early Osmond Other Clinician: Referring Provider: Treating Provider/Extender: RO BSO N, MICHA EL G Pahwani, Rinka Weeks in Treatment: 1 Active Problems ICD-10 Encounter Code Description Active Date MDM Diagnosis S51.012A Laceration without foreign body of left elbow, initial encounter 06/16/2022 No Yes F43.12 Post-traumatic stress disorder, chronic 06/16/2022 No Yes Inactive Problems Resolved Problems Electronic Signature(s) Signed: 06/23/2022 4:38:26 PM By: Linton Ham MD Entered By: Linton Ham on 06/23/2022 10:56:54 -------------------------------------------------------------------------------- Progress Note Details Patient Name: Date of Service: Cheryl Melendez. 06/23/2022 10:15 A M Medical Record Number: [id number] Patient Account Number: [id number] Date of Birth/Sex: Treating RN: Feb 11, 1951 (72 y.o. Cheryl Melendez Primary Care Provider: Early Osmond Other Clinician: Referring Provider: Treating Provider/Extender: RO BSO N, MICHA EL G Pahwani, Rinka Weeks in Treatment: 1 Subjective History of Present Illness (HPI) 06-16-2022 upon evaluation today patient presents today for an initial evaluation in our clinic concerning a left elbow injury which occurred actually on or around the beginning of January 2024. She actually had an x-ray which was negative for fracture on 05-02-2022. Subsequently however she has had an open wound since that time and there has been some speak of infection. She was actually on Augmentin and Cipro which was completed 2 weeks ago. She has been doing wet-to-dry dressings at this point. With that being said the wound is still very open and she continues to have falls which unfortunately is not helping either. I do  not see any signs of active infection at this time which is great news I do not think she requires any antibiotics as of today. Cheryl Melendez, Cheryl Melendez ([id number]) [id number] Page 4 of 5 The patient has a history mainly of just posttraumatic  stress disorder which unfortunately is quite severe and is affecting her in all manners as far as her walking is concerned she also has spasms and has very Parkinson-like symptoms which are all attributed to the PTSD. With that being said this also causes her to fall at times which is where this injury came from and she has had other injuries where she is fallen and hurt herself as well. We discussed the possibility of using elbow protectors such as skaters would use in order to pad the area over the elbow I think this could actually be of benefit for her. 3/4; second visit for this patient who suffered a traumatic wound to the left elbow. She received 2 different courses of antibiotics I am not sure of the benefit of the antibiotics in any case she is off these now. We have been using Prisma and sit to vet. The wound is actually quite deep but does not probe to bone Objective Constitutional Patient is hypertensive.Marland Kitchen Heart rate irregular.. Temperature is normal and within the target range for the patient.Marland Kitchen appears in no distress. Vitals Time Taken: 10:32 AM, Height: 66 in, Weight: 150 lbs, BMI: 24.2, Temperature: 98.5 F, Pulse: 121 bpm, Respiratory Rate: 18 breaths/min, Blood Pressure: 185/63 mmHg. Respiratory Respiratory effort is easy and symmetric bilaterally. Rate is normal at rest and on room air.. General Notes: Multiple bilateral wound exam; the wound has good granulation but deep relative depth. There is no obvious infection and does not probe to bone. Integumentary (Hair, Skin) Wound #1 status is Open. Original cause of wound was Trauma. The date acquired was: 04/21/2022. The wound has been in treatment 1 weeks. The wound  is located on the Left Elbow. The wound measures 1cm length x 0.5cm width x 0.3cm depth; 0.393cm^2 area and 0.118cm^3 volume. There is Fat Layer (Subcutaneous Tissue) exposed. There is no tunneling noted, however, there is undermining starting at 12:00 and ending at 12:00 with a maximum distance of 2.4cm. There is a medium amount of serosanguineous drainage noted. There is large (67-100%) red granulation within the wound bed. There is a small (1-33%) amount of necrotic tissue within the wound bed including Adherent Slough. Assessment Active Problems ICD-10 Laceration without foreign body of left elbow, initial encounter Post-traumatic stress disorder, chronic Plan Follow-up Appointments: Return Appointment in 1 week. Bathing/ Shower/ Hygiene: Wash wounds with antibacterial soap and water. Anesthetic (Use 'Patient Medications' Section for Anesthetic Order Entry): Lidocaine applied to wound bed WOUND #1: - Elbow Wound Laterality: Left Cleanser: Byram Ancillary Kit - 15 Day Supply (Generic) 3 x Per Week/30 Days Discharge Instructions: Use supplies as instructed; Kit contains: (15) Saline Bullets; (15) 3x3 Gauze; 15 pr Gloves Cleanser: Soap and Water 3 x Per Week/30 Days Discharge Instructions: Gently cleanse wound with antibacterial soap, rinse and pat dry prior to dressing wounds Cleanser: Wound Cleanser 3 x Per Week/30 Days Discharge Instructions: Wash your hands with soap and water. Remove old dressing, discard into plastic bag and place into trash. Cleanse the wound with Wound Cleanser prior to applying a clean dressing using gauze sponges, not tissues or cotton balls. Do not scrub or use excessive force. Pat dry using gauze sponges, not tissue or cotton balls. Prim Dressing: Prisma 4.34 (in) 3 x Per Week/30 Days ary Discharge Instructions: Moisten w/normal saline or sterile water; Cover wound as directed. Do not remove from wound bed. Secondary Dressing: (BORDER) Zetuvit Plus SILICONE  BORDER Dressing 4x4 (in/in) (Generic) 3 x Per Week/30 Days Discharge Instructions: Please do  not put silicone bordered dressings under wraps. Use non-bordered dressing only. 1. Prisma, Zetuvut border foam change every 2 days 2. The exact nature of her neurologic illness is not really clear. I did not look back through epic 3. I agree with a review by plastic surgery. The wound does not look to be infected without evidence of osteomyelitis at this date Electronic Signature(s) Signed: 06/23/2022 4:38:26 PM By: Linton Ham MD [place], Lady 06/23/2022 4:38:26 PM By: Linton Ham MD Signed: D ([id number]) [id number] Page 5 of 5 Entered By: Linton Ham on 06/23/2022 11:02:35 -------------------------------------------------------------------------------- SuperBill Details Patient Name: Date of Service: Cheryl Melendez, Cheryl Melendez 06/23/2022 Medical Record Number: [id number] Patient Account Number: [id number] Date of Birth/Sex: Treating RN: 1950/12/10 (72 y.o. Cheryl Melendez Primary Care Provider: Early Osmond Other Clinician: Referring Provider: Treating Provider/Extender: RO BSO N, MICHA EL G Pahwani, Rinka Weeks in Treatment: 1 Diagnosis Coding ICD-10 Codes Code Description [id number] Laceration without foreign body of left elbow, initial encounter F43.12 Post-traumatic stress disorder, chronic Facility Procedures : CPT4 Code: [id number] Description: [id number] - WOUND CARE VISIT-LEV 2 EST PT Modifier: Quantity: 1 Physician Procedures : CPT4 Code Description Modifier [id number] - WC PHYS LEVEL 3 - EST PT ICD-10 Diagnosis Description [id number] Laceration without foreign body of left elbow, initial encounter F43.12 Post-traumatic stress disorder, chronic Quantity: 1 Electronic Signature(s) Signed: 06/23/2022 4:38:26 PM By: Linton Ham MD Entered By: Linton Ham on 06/23/2022 11:02:54

## 2022-06-27 NOTE — Progress Notes (Signed)
Cheryl Melendez, Cheryl Melendez ([id number]) [id number] Page 1 of 9 Visit Report for 06/23/2022 Arrival Information Details Patient Name: Date of Service: Cheryl Melendez, Cheryl Melendez 06/23/2022 10:15 A M Medical Record Number: [id number] Patient Account Number: [id number] Date of Birth/Sex: Treating RN: 18-Feb-1951 (72 y.o. Cheryl Melendez Primary Care Cheryl Melendez: Cheryl Melendez Other Clinician: Referring Cheryl Melendez: Treating Cheryl Melendez/Extender: RO BSO N, Cheryl Melendez, Cheryl Melendez in Treatment: 1 Visit Information History Since Last Visit Added or deleted any medications: No Patient Arrived: Ambulatory Any new allergies or adverse reactions: No Arrival Time: 10:31 Had a fall or experienced change in No Accompanied By: husband activities of daily living that may affect Transfer Assistance: None risk of falls: Patient Identification Verified: Yes Signs or symptoms of abuse/neglect since last visito No Secondary Verification Process Completed: Yes Hospitalized since last visit: No Patient Requires Transmission-Based Precautions: No Implantable device outside of the clinic excluding No Patient Has Alerts: No cellular tissue based products placed in the center since last visit: Has Dressing in Place as Prescribed: Yes Pain Present Now: No Electronic Signature(s) Signed: 06/25/2022 3:12:30 PM By: Cheryl Coria RN Entered By: Cheryl Melendez on 06/23/2022 10:32:03 -------------------------------------------------------------------------------- Clinic Level of Care Assessment Details Patient Name: Date of Service: Cheryl Melendez 06/23/2022 10:15 A M Medical Record Number: [id number] Patient Account Number: [id number] Date of Birth/Sex: Treating RN: 1950/07/14 (72 y.o. Cheryl Melendez Primary Care Chapman Matteucci: Cheryl Melendez Other Clinician: Referring Carmalita Wakefield: Treating Malaiyah Achorn/Extender: RO BSO N, Cheryl Melendez, Cheryl Melendez in Treatment: 1 Clinic Level of Care Assessment Items TOOL 4  Quantity Score X- 1 0 Use when only an EandM is performed on FOLLOW-UP visit ASSESSMENTS - Nursing Assessment / Reassessment X- 1 10 Reassessment of Co-morbidities (includes updates in patient status) X- 1 5 Reassessment of Adherence to Treatment Plan Cheryl Melendez, Cheryl Melendez ([id number]) [id number] Page 2 of 9 ASSESSMENTS - Wound and Skin A ssessment / Reassessment X - Simple Wound Assessment / Reassessment - one wound 1 5 '[]'$  - 0 Complex Wound Assessment / Reassessment - multiple wounds '[]'$  - 0 Dermatologic / Skin Assessment (not related to wound area) ASSESSMENTS - Focused Assessment '[]'$  - 0 Circumferential Edema Measurements - multi extremities '[]'$  - 0 Nutritional Assessment / Counseling / Intervention '[]'$  - 0 Lower Extremity Assessment (monofilament, tuning fork, pulses) '[]'$  - 0 Peripheral Arterial Disease Assessment (using hand held doppler) ASSESSMENTS - Ostomy and/or Continence Assessment and Care '[]'$  - 0 Incontinence Assessment and Management '[]'$  - 0 Ostomy Care Assessment and Management (repouching, etc.) PROCESS - Coordination of Care X - Simple Patient / Family Education for ongoing care 1 15 '[]'$  - 0 Complex (extensive) Patient / Family Education for ongoing care '[]'$  - 0 Staff obtains Programmer, systems, Records, T Results / Process Orders est '[]'$  - 0 Staff telephones HHA, Nursing Homes / Clarify orders / etc '[]'$  - 0 Routine Transfer to another Facility (non-emergent condition) '[]'$  - 0 Routine Hospital Admission (non-emergent condition) '[]'$  - 0 New Admissions / Biomedical engineer / Ordering NPWT Apligraf, etc. , '[]'$  - 0 Emergency Hospital Admission (emergent condition) X- 1 10 Simple Discharge Coordination '[]'$  - 0 Complex (extensive) Discharge Coordination PROCESS - Special Needs '[]'$  - 0 Pediatric / Minor Patient Management '[]'$  - 0 Isolation Patient Management '[]'$  - 0 Hearing / Language / Visual special needs '[]'$  - 0 Assessment of Community assistance  (transportation, D/C planning, etc.) '[]'$  - 0 Additional assistance / Altered mentation '[]'$  - 0 Support Surface(s) Assessment (bed, cushion, seat, etc.) INTERVENTIONS -  Wound Cleansing / Measurement X - Simple Wound Cleansing - one wound 1 5 '[]'$  - 0 Complex Wound Cleansing - multiple wounds X- 1 5 Wound Imaging (photographs - any number of wounds) '[]'$  - 0 Wound Tracing (instead of photographs) X- 1 5 Simple Wound Measurement - one wound '[]'$  - 0 Complex Wound Measurement - multiple wounds INTERVENTIONS - Wound Dressings X - Small Wound Dressing one or multiple wounds 1 10 '[]'$  - 0 Medium Wound Dressing one or multiple wounds '[]'$  - 0 Large Wound Dressing one or multiple wounds '[]'$  - 0 Application of Medications - topical '[]'$  - 0 Application of Medications - injection INTERVENTIONS - Miscellaneous '[]'$  - 0 External ear exam Cheryl Melendez, Cheryl Melendez ([id number]) [id number] Page 3 of 9 '[]'$  - 0 Specimen Collection (cultures, biopsies, blood, body fluids, etc.) '[]'$  - 0 Specimen(s) / Culture(s) sent or taken to Lab for analysis '[]'$  - 0 Patient Transfer (multiple staff / Harrel Lemon Lift / Similar devices) '[]'$  - 0 Simple Staple / Suture removal (25 or less) '[]'$  - 0 Complex Staple / Suture removal (26 or more) '[]'$  - 0 Hypo / Hyperglycemic Management (close monitor of Blood Glucose) '[]'$  - 0 Ankle / Brachial Index (ABI) - do not check if billed separately X- 1 5 Vital Signs Has the patient been seen at the hospital within the last three years: Yes Total Score: 75 Level Of Care: New/Established - Level 2 Electronic Signature(s) Signed: 06/25/2022 3:12:30 PM By: Cheryl Coria RN Entered By: Cheryl Melendez on 06/23/2022 10:52:30 -------------------------------------------------------------------------------- Encounter Discharge Information Details Patient Name: Date of Service: Cheryl Coss D. 06/23/2022 10:15 A M Medical Record Number: [id number] Patient Account Number: [id number] Date  of Birth/Sex: Treating RN: Jul 26, 1950 (72 y.o. Cheryl Melendez Primary Care Emya Picado: Cheryl Melendez Other Clinician: Referring Shelsea Hangartner: Treating Keidrick Murty/Extender: RO BSO N, Cheryl Melendez, Cheryl Melendez in Treatment: 1 Encounter Discharge Information Items Discharge Condition: Stable Ambulatory Status: Ambulatory Discharge Destination: Home Transportation: Private Auto Accompanied By: husband Schedule Follow-up Appointment: Yes Clinical Summary of Care: Electronic Signature(s) Signed: 06/25/2022 3:12:30 PM By: Cheryl Coria RN Entered By: Cheryl Melendez on 06/23/2022 10:53:45 -------------------------------------------------------------------------------- Lower Extremity Assessment Details Patient Name: Date of Service: RENATTA, YOUSEFI 06/23/2022 10:15 A LOWANA, MONIER D ([id number]) [id number] Page 4 of 9 Medical Record Number: [id number] Patient Account Number: [id number] Date of Birth/Sex: Treating RN: 09-26-1950 (72 y.o. Cheryl Melendez Primary Care Mazella Deen: Cheryl Melendez Other Clinician: Referring Minami Arriaga: Treating Duffy Dantonio/Extender: RO BSO N, Cheryl Melendez, Cheryl Melendez in Treatment: 1 Electronic Signature(s) Signed: 06/25/2022 3:12:30 PM By: Cheryl Coria RN Entered By: Cheryl Melendez on 06/23/2022 10:39:00 -------------------------------------------------------------------------------- Multi Wound Chart Details Patient Name: Date of Service: Cheryl Coss D. 06/23/2022 10:15 A M Medical Record Number: [id number] Patient Account Number: [id number] Date of Birth/Sex: Treating RN: [birth year] (72 y.o. Cheryl Melendez Primary Care Eulan Heyward: Cheryl Melendez Other Clinician: Referring Jaynia Fendley: Treating Kapri Nero/Extender: RO BSO N, Cheryl Melendez, Cheryl Melendez in Treatment: 1 Vital Signs Height(in): 66 Pulse(bpm): 121 Weight(lbs): 150 Blood Pressure(mmHg): 185/63 Body Mass Index(BMI): 24.2 Temperature(F): 98.5 Respiratory  Rate(breaths/min): 18 [1:Photos:] [N/A:N/A] Left Elbow N/A N/A Wound Location: Trauma N/A N/A Wounding Event: Lesion N/A N/A Primary Etiology: 04/21/2022 N/A N/A Date Acquired: 1 N/A N/A Melendez of Treatment: Open N/A N/A Wound Status: No N/A N/A Wound Recurrence: 1x0.5x0.3 N/A N/A Measurements L x W x D (cm) 0.393 N/A N/A A (cm) : rea 0.118 N/A N/A Volume (cm) : -11.30% N/A N/A % Reduction  in A rea: 44.30% N/A N/A % Reduction in Volume: 12 Starting Position 1 (o'clock): 12 Ending Position 1 (o'clock): 2.4 Maximum Distance 1 (cm): Yes N/A N/A Undermining: Full Thickness Without Exposed N/A N/A Classification: Support Structures Medium N/A N/A Exudate Amount: Serosanguineous N/A N/A Exudate Type: red, brown N/A N/A Exudate Color: Large (67-100%) N/A N/A Granulation Amount: Red N/A N/A Granulation Quality: Small (1-33%) N/A N/A Necrotic Amount: Fat Layer (Subcutaneous Tissue): Yes N/A N/A Exposed Structures: Fascia: No Cheryl Melendez, Cheryl Melendez ([id number]) [id number] Page 5 of 9 Tendon: No Muscle: No Joint: No Bone: No None N/A N/A Epithelialization: Treatment Notes Electronic Signature(s) Signed: 06/25/2022 3:12:30 PM By: Cheryl Coria RN Entered By: Cheryl Melendez on 06/23/2022 10:40:36 -------------------------------------------------------------------------------- Richards Details Patient Name: Date of Service: Cheryl Coss D. 06/23/2022 10:15 A M Medical Record Number: [id number] Patient Account Number: [id number] Date of Birth/Sex: Treating RN: 01-May-1950 (72 y.o. Cheryl Melendez Primary Care Dempsey Ahonen: Cheryl Melendez Other Clinician: Referring Kysean Sweet: Treating Cinque Begley/Extender: RO BSO N, Cheryl Melendez, Cheryl Melendez in Treatment: 1 Active Inactive Abuse / Safety / Falls / Self Care Management Nursing Diagnoses: Potential for injury related to falls Goals: Patient will remain injury free related to  falls Date Initiated: 06/16/2022 Target Resolution Date: 07/15/2022 Goal Status: Active Interventions: Assess Activities of Daily Living upon admission and as needed Assess fall risk on admission and as needed Assess: immobility, friction, shearing, incontinence upon admission and as needed Assess impairment of mobility on admission and as needed per policy Assess personal safety and home safety (as indicated) on admission and as needed Assess self care needs on admission and as needed Notes: Necrotic Tissue Nursing Diagnoses: Knowledge deficit related to management of necrotic/devitalized tissue Goals: Necrotic/devitalized tissue will be minimized in the wound bed Date Initiated: 06/16/2022 Target Resolution Date: 07/15/2022 Goal Status: Active Interventions: Assess patient pain level pre-, during and post procedure and prior to discharge Provide education on necrotic tissue and debridement process Notes: Cheryl Melendez, Cheryl Melendez ([id number]) [id number] Page 6 of 9 Nursing Diagnoses: Knowledge deficit related to ulceration/compromised skin integrity Goals: Patient/caregiver will verbalize understanding of skin care regimen Date Initiated: 06/16/2022 Target Resolution Date: 07/15/2022 Goal Status: Active Ulcer/skin breakdown will have a volume reduction of 30% by week 4 Date Initiated: 06/16/2022 Target Resolution Date: 07/15/2022 Goal Status: Active Ulcer/skin breakdown will have a volume reduction of 50% by week 8 Date Initiated: 06/16/2022 Target Resolution Date: 08/15/2022 Goal Status: Active Ulcer/skin breakdown will have a volume reduction of 80% by week 12 Date Initiated: 06/16/2022 Target Resolution Date: 09/14/2022 Goal Status: Active Ulcer/skin breakdown will heal within 14 Melendez Date Initiated: 06/16/2022 Target Resolution Date: 10/15/2022 Goal Status: Active Interventions: Assess patient/caregiver ability to obtain necessary  supplies Assess patient/caregiver ability to perform ulcer/skin care regimen upon admission and as needed Assess ulceration(s) every visit Notes: Electronic Signature(s) Signed: 06/25/2022 3:12:30 PM By: Cheryl Coria RN Entered By: Cheryl Melendez on 06/23/2022 10:53:10 -------------------------------------------------------------------------------- Pain Assessment Details Patient Name: Date of Service: Cheryl Melendez, Cheryl Melendez 06/23/2022 10:15 A M Medical Record Number: [id number] Patient Account Number: [id number] Date of Birth/Sex: Treating RN: [birth year] (72 y.o. Cheryl Melendez Primary Care Valoree Agent: Cheryl Melendez Other Clinician: Referring Darleene Cumpian: Treating Gloristine Turrubiates/Extender: RO BSO N, Cheryl Melendez, Cheryl Melendez in Treatment: 1 Active Problems Location of Pain Severity and Description of Pain Patient Has Paino No Site Locations [place], [place] D ([id number]) [phone].pdf Page 7 of 9 Pain Management and Medication Current Pain Management: Electronic Signature(s) Signed: 06/25/2022  3:12:30 PM By: Cheryl Coria RN Entered By: Cheryl Melendez on 06/23/2022 10:32:36 -------------------------------------------------------------------------------- Patient/Caregiver Education Details Patient Name: Date of Service: Cheryl Melendez 3/4/2024andnbsp10:15 [place] Record Number: [id number] Patient Account Number: [id number] Date of Birth/Gender: Treating RN: 1951/01/19 (72 y.o. Cheryl Melendez Primary Care Physician: Cheryl Melendez Other Clinician: Referring Physician: Treating Physician/Extender: RO BSO N, Cheryl Melendez, Cheryl Melendez in Treatment: 1 Education Assessment Education Provided To: Patient Education Topics Provided Welcome T [facility] Packet: o Methods: Explain/Verbal Responses: State content correctly Electronic Signature(s) Signed: 06/25/2022 3:12:30 PM By: Cheryl Coria RN Entered By: Cheryl Melendez on 06/23/2022  10:39:42 Cassandria Santee D ([id number]) [id number] Page 8 of 9 -------------------------------------------------------------------------------- Wound Assessment Details Patient Name: Date of Service: Cheryl Melendez, Cheryl Melendez 06/23/2022 10:15 A M Medical Record Number: [id number] Patient Account Number: [id number] Date of Birth/Sex: Treating RN: 1951/01/18 (72 y.o. Cheryl Melendez Primary Care Tonji Elliff: Cheryl Melendez Other Clinician: Referring Josede Cicero: Treating Dareon Nunziato/Extender: RO BSO N, Cheryl Melendez, Cheryl Melendez in Treatment: 1 Wound Status Wound Number: 1 Primary Etiology: Lesion Wound Location: Left Elbow Wound Status: Open Wounding Event: Trauma Date Acquired: 04/21/2022 Melendez Of Treatment: 1 Clustered Wound: No Photos Wound Measurements Length: (cm) 1 Width: (cm) 0.5 Depth: (cm) 0.3 Area: (cm) 0.393 Volume: (cm) 0.118 % Reduction in Area: -11.3% % Reduction in Volume: 44.3% Epithelialization: None Tunneling: No Undermining: Yes Starting Position (o'clock): 12 Ending Position (o'clock): 12 Maximum Distance: (cm) 2.4 Wound Description Classification: Full Thickness Without Exposed Suppor Exudate Amount: Medium Exudate Type: Serosanguineous Exudate Color: red, brown t Structures Foul Odor After Cleansing: No Slough/Fibrino Yes Wound Bed Granulation Amount: Large (67-100%) Exposed Structure Granulation Quality: Red Fascia Exposed: No Necrotic Amount: Small (1-33%) Fat Layer (Subcutaneous Tissue) Exposed: Yes Necrotic Quality: Adherent Slough Tendon Exposed: No Muscle Exposed: No Joint Exposed: No Bone Exposed: No Treatment Notes Wound #1 (Elbow) Wound Laterality: Left Cleanser MALAYIAH, SCOZZARI ([id number]) [id number] Page 9 of 9 [facility] Ancillary Kit - 15 Day Supply Discharge Instruction: Use supplies as instructed; Kit contains: (15) Saline Bullets; (15) 3x3 Gauze; 15 pr Gloves Soap and Water Discharge  Instruction: Gently cleanse wound with antibacterial soap, rinse and pat dry prior to dressing wounds Wound Cleanser Discharge Instruction: Wash your hands with soap and water. Remove old dressing, discard into plastic bag and place into trash. Cleanse the wound with Wound Cleanser prior to applying a clean dressing using gauze sponges, not tissues or cotton balls. Do not scrub or use excessive force. Pat dry using gauze sponges, not tissue or cotton balls. Peri-Wound Care Topical Primary Dressing Prisma 4.34 (in) Discharge Instruction: Moisten w/normal saline or sterile water; Cover wound as directed. Do not remove from wound bed. Secondary Dressing (BORDER) Zetuvit Plus SILICONE BORDER Dressing 4x4 (in/in) Discharge Instruction: Please do not put silicone bordered dressings under wraps. Use non-bordered dressing only. Secured With Compression Wrap Compression Stockings Environmental education officer) Signed: 06/25/2022 3:12:30 PM By: Cheryl Coria RN Entered By: Cheryl Melendez on 06/23/2022 10:38:28 -------------------------------------------------------------------------------- Vitals Details Patient Name: Date of Service: Cheryl Coss D. 06/23/2022 10:15 A M Medical Record Number: [id number] Patient Account Number: [id number] Date of Birth/Sex: Treating RN: 1950/06/08 (72 y.o. Cheryl Melendez Primary Care Fatiha Guzy: Cheryl Melendez Other Clinician: Referring Random Dobrowski: Treating Jeffrie Lofstrom/Extender: RO BSO N, Cheryl Melendez, Cheryl Melendez in Treatment: 1 Vital Signs Time Taken: 10:32 Temperature (F): 98.5 Height (in): 66 Pulse (bpm): 121 Weight (lbs): 150 Respiratory Rate (breaths/min): 18 Body Mass Index (  BMI): 24.2 Blood Pressure (mmHg): 185/63 Reference Range: 80 - 120 mg / dl Electronic Signature(s) Signed: 06/25/2022 3:12:30 PM By: Cheryl Coria RN Entered By: Cheryl Melendez on 06/23/2022 10:32:30

## 2022-06-30 ENCOUNTER — Encounter (HOSPITAL_BASED_OUTPATIENT_CLINIC_OR_DEPARTMENT_OTHER): Payer: Medicare PPO | Admitting: Internal Medicine

## 2022-06-30 ENCOUNTER — Other Ambulatory Visit
Admission: RE | Admit: 2022-06-30 | Discharge: 2022-06-30 | Disposition: A | Discharge status: Home / Self Care | Payer: Medicare PPO | Source: Ambulatory Visit | Attending: Internal Medicine | Admitting: Internal Medicine

## 2022-06-30 DIAGNOSIS — L0889 Other specified local infections of the skin and subcutaneous tissue: Secondary | ICD-10-CM | POA: Diagnosis not present

## 2022-06-30 DIAGNOSIS — F4312 Post-traumatic stress disorder, chronic: Secondary | ICD-10-CM

## 2022-06-30 DIAGNOSIS — S51012A Laceration without foreign body of left elbow, initial encounter: Secondary | ICD-10-CM | POA: Diagnosis not present

## 2022-06-30 DIAGNOSIS — I10 Essential (primary) hypertension: Secondary | ICD-10-CM | POA: Diagnosis not present

## 2022-06-30 NOTE — Progress Notes (Signed)
Cheryl, Melendez ([id number]) [id number] Page 1 of 9 Visit Report for 06/30/2022 Arrival Information Details Patient Name: Date of Service: Cheryl Melendez, Cheryl Melendez 06/30/2022 10:00 A M Medical Record Number: [id number] Patient Account Number: [id number] Date of Birth/Sex: Treating RN: 1950/11/23 (72 y.o. [name] Primary Care Aishani Kalis: Early Osmond Other Clinician: Referring Tationna Fullard: Treating Alleyne Lac/Extender: Kendrick Fries in Treatment: 2 Visit Information History Since Last Visit Added or deleted any medications: No Patient Arrived: Cane Any new allergies or adverse reactions: No Arrival Time: 10:16 Had a fall or experienced change in No Accompanied By: husband activities of daily living that may affect Transfer Assistance: None risk of falls: Patient Identification Verified: Yes Hospitalized since last visit: No Secondary Verification Process Completed: Yes Has Dressing in Place as Prescribed: Yes Patient Requires Transmission-Based Precautions: No Has Compression in Place as Prescribed: No Patient Has Alerts: No Pain Present Now: Yes Electronic Signature(s) Signed: 06/30/2022 3:25:33 PM By: Levora Dredge Entered By: Levora Dredge on 06/30/2022 10:17:24 -------------------------------------------------------------------------------- Clinic Level of Care Assessment Details Patient Name: Date of Service: Cheryl, Melendez 06/30/2022 10:00 A M Medical Record Number: [id number] Patient Account Number: [id number] Date of Birth/Sex: Treating RN: 09-20-1950 (72 y.o. [name] Primary Care Micheala Morissette: Early Osmond Other Clinician: Referring Jeneva Schweizer: Treating Timber Lucarelli/Extender: Kendrick Fries in Treatment: 2 Clinic Level of Care Assessment Items TOOL 4 Quantity Score '[]'$  - 0 Use when only an EandM is performed on FOLLOW-UP visit ASSESSMENTS - Nursing Assessment / Reassessment X- 1  10 Reassessment of Co-morbidities (includes updates in patient status) X- 1 5 Reassessment of Adherence to Treatment Plan ASSESSMENTS - Wound and Skin A ssessment / Reassessment X - Simple Wound Assessment / Reassessment - one wound 1 5 STEFANI, GRITTER D ([id number]) [id number] Page 2 of 9 '[]'$  - 0 Complex Wound Assessment / Reassessment - multiple wounds '[]'$  - 0 Dermatologic / Skin Assessment (not related to wound area) ASSESSMENTS - Focused Assessment '[]'$  - 0 Circumferential Edema Measurements - multi extremities '[]'$  - 0 Nutritional Assessment / Counseling / Intervention '[]'$  - 0 Lower Extremity Assessment (monofilament, tuning fork, pulses) '[]'$  - 0 Peripheral Arterial Disease Assessment (using hand held doppler) ASSESSMENTS - Ostomy and/or Continence Assessment and Care '[]'$  - 0 Incontinence Assessment and Management '[]'$  - 0 Ostomy Care Assessment and Management (repouching, etc.) PROCESS - Coordination of Care X - Simple Patient / Family Education for ongoing care 1 15 '[]'$  - 0 Complex (extensive) Patient / Family Education for ongoing care '[]'$  - 0 Staff obtains Programmer, systems, Records, T Results / Process Orders est '[]'$  - 0 Staff telephones HHA, Nursing Homes / Clarify orders / etc '[]'$  - 0 Routine Transfer to another Facility (non-emergent condition) '[]'$  - 0 Routine Hospital Admission (non-emergent condition) '[]'$  - 0 New Admissions / Biomedical engineer / Ordering NPWT Apligraf, etc. , '[]'$  - 0 Emergency Hospital Admission (emergent condition) X- 1 10 Simple Discharge Coordination '[]'$  - 0 Complex (extensive) Discharge Coordination PROCESS - Special Needs '[]'$  - 0 Pediatric / Minor Patient Management '[]'$  - 0 Isolation Patient Management '[]'$  - 0 Hearing / Language / Visual special needs '[]'$  - 0 Assessment of Community assistance (transportation, D/C planning, etc.) '[]'$  - 0 Additional assistance / Altered mentation '[]'$  - 0 Support Surface(s) Assessment (bed,  cushion, seat, etc.) INTERVENTIONS - Wound Cleansing / Measurement X - Simple Wound Cleansing - one wound 1 5 '[]'$  - 0 Complex Wound Cleansing - multiple wounds X- 1 5 Wound Imaging (photographs -  any number of wounds) '[]'$  - 0 Wound Tracing (instead of photographs) X- 1 5 Simple Wound Measurement - one wound '[]'$  - 0 Complex Wound Measurement - multiple wounds INTERVENTIONS - Wound Dressings X - Small Wound Dressing one or multiple wounds 1 10 '[]'$  - 0 Medium Wound Dressing one or multiple wounds '[]'$  - 0 Large Wound Dressing one or multiple wounds X- 1 5 Application of Medications - topical '[]'$  - 0 Application of Medications - injection INTERVENTIONS - Miscellaneous '[]'$  - 0 External ear exam '[]'$  - 0 Specimen Collection (cultures, biopsies, blood, body fluids, etc.) '[]'$  - 0 Specimen(s) / Culture(s) sent or taken to Lab for analysis JUAQUINA, MEEHL ([id number]) [id number] Page 3 of 9 '[]'$  - 0 Patient Transfer (multiple staff / Civil Service fast streamer / Similar devices) '[]'$  - 0 Simple Staple / Suture removal (25 or less) '[]'$  - 0 Complex Staple / Suture removal (26 or more) '[]'$  - 0 Hypo / Hyperglycemic Management (close monitor of Blood Glucose) '[]'$  - 0 Ankle / Brachial Index (ABI) - do not check if billed separately X- 1 5 Vital Signs Has the patient been seen at the hospital within the last three years: Yes Total Score: 80 Level Of Care: New/Established - Level 3 Electronic Signature(s) Signed: 06/30/2022 3:25:33 PM By: Levora Dredge Entered By: Levora Dredge on 06/30/2022 11:03:06 -------------------------------------------------------------------------------- Encounter Discharge Information Details Patient Name: Date of Service: Cheryl Coss D. 06/30/2022 10:00 A M Medical Record Number: [id number] Patient Account Number: [id number] Date of Birth/Sex: Treating RN: [birth year] (72 y.o. [name] Primary Care Soleia Badolato: Early Osmond Other  Clinician: Referring Bisma Klett: Treating Harlow Basley/Extender: Kendrick Fries in Treatment: 2 Encounter Discharge Information Items Discharge Condition: Stable Ambulatory Status: Cane Discharge Destination: Home Transportation: Private Auto Accompanied By: husband Schedule Follow-up Appointment: Yes Clinical Summary of Care: Electronic Signature(s) Signed: 06/30/2022 3:25:33 PM By: Levora Dredge Entered By: Levora Dredge on 06/30/2022 11:04:18 -------------------------------------------------------------------------------- Lower Extremity Assessment Details Patient Name: Date of Service: TEASIA, ZANINI 06/30/2022 10:00 A M Medical Record Number: [id number] Patient Account Number: [id number] Date of Birth/Sex: Treating RN: 1950/10/03 (72 y.o. [name] Primary Care Dawt Reeb: Early Osmond Other Clinician: MELISSE, FLECKER ([id number]) [phone].pdf Page 4 of 9 Referring Shabria Egley: Treating Clearnce Leja/Extender: Young Berry, Rinka Weeks in Treatment: 2 Electronic Signature(s) Signed: 06/30/2022 3:25:33 PM By: Levora Dredge Entered By: Levora Dredge on 06/30/2022 10:30:11 -------------------------------------------------------------------------------- Multi Wound Chart Details Patient Name: Date of Service: Cheryl Coss D. 06/30/2022 10:00 A M Medical Record Number: [id number] Patient Account Number: [id number] Date of Birth/Sex: Treating RN: Dec 15, 1950 (72 y.o. [name] Primary Care Amillion Macchia: Early Osmond Other Clinician: Referring Clair Alfieri: Treating Osby Sweetin/Extender: Young Berry, Rinka Weeks in Treatment: 2 Vital Signs Height(in): 66 Pulse(bpm): 102 Weight(lbs): 150 Blood Pressure(mmHg): 173/71 Body Mass Index(BMI): 24.2 Temperature(F): 97.5 Respiratory Rate(breaths/min): 18 [1:Photos: No Photos Left Elbow Wound Location: Trauma Wounding Event: Lesion Primary Etiology:  04/21/2022 Date Acquired: 2 Weeks of Treatment: Open Wound Status: No Wound Recurrence: 1.1x0.5x0.3 Measurements L x W x D (cm) 0.432 A (cm) : rea 0.13 Volume (cm) :  -22.40% % Reduction in A rea: 38.70% % Reduction in Volume: 12 Starting Position 1 (o'clock): 12 Ending Position 1 (o'clock): 1.2 Maximum Distance 1 (cm): Yes Undermining: Full Thickness Without Exposed Classification: Support Structures Medium Exudate  Amount: Serosanguineous Exudate Type: red, brown Exudate Color: Medium (34-66%) Granulation Amount: Red, Pink Granulation Quality: Medium (34-66%) Necrotic Amount: Fat Layer (Subcutaneous Tissue): Yes N/A Exposed Structures: Fascia: No  Tendon: No Muscle:  No Joint: No Bone: No None Epithelialization:] [N/A:N/A N/A N/A N/A N/A N/A N/A N/A N/A N/A N/A N/A N/A N/A N/A N/A N/A N/A N/A N/A N/A N/A] Treatment Notes Electronic Signature(s) Signed: 06/30/2022 3:25:33 PM By: Ocie Cornfield, [place] 06/30/2022 3:25:33 PM By: Levora Dredge Signed: D ([id number]) [id number] Page 5 of 9 Entered By: Levora Dredge on 06/30/2022 10:30:16 -------------------------------------------------------------------------------- Multi-Disciplinary Care Plan Details Patient Name: Date of Service: DALENA, GRACI 06/30/2022 10:00 A M Medical Record Number: [id number] Patient Account Number: [id number] Date of Birth/Sex: Treating RN: 14-Oct-1950 (72 y.o. Drema Dallas, Urban Gibson Primary Care Rogerio Boutelle: Early Osmond Other Clinician: Referring Merrissa Giacobbe: Treating Kenzo Ozment/Extender: Young Berry, Rinka Weeks in Treatment: 2 Active Inactive Abuse / Safety / Falls / Self Care Management Nursing Diagnoses: Potential for injury related to falls Goals: Patient will remain injury free related to falls Date Initiated: 06/16/2022 Target Resolution Date: 07/15/2022 Goal Status: Active Interventions: Assess Activities of Daily Living upon admission and as needed Assess fall  risk on admission and as needed Assess: immobility, friction, shearing, incontinence upon admission and as needed Assess impairment of mobility on admission and as needed per policy Assess personal safety and home safety (as indicated) on admission and as needed Assess self care needs on admission and as needed Notes: Necrotic Tissue Nursing Diagnoses: Knowledge deficit related to management of necrotic/devitalized tissue Goals: Necrotic/devitalized tissue will be minimized in the wound bed Date Initiated: 06/16/2022 Target Resolution Date: 07/15/2022 Goal Status: Active Interventions: Assess patient pain level pre-, during and post procedure and prior to discharge Provide education on necrotic tissue and debridement process Notes: Wound/Skin Impairment Nursing Diagnoses: Knowledge deficit related to ulceration/compromised skin integrity Goals: Patient/caregiver will verbalize understanding of skin care regimen Date Initiated: 06/16/2022 Date Inactivated: 06/30/2022 Target Resolution Date: 07/15/2022 Goal Status: Met Ulcer/skin breakdown will have a volume reduction of 30% by week 4 Date Initiated: 06/16/2022 Target Resolution Date: 07/15/2022 Goal Status: Active CORDILIA, RHEAULT ([id number]) [id number] Page 6 of 9 Ulcer/skin breakdown will have a volume reduction of 50% by week 8 Date Initiated: 06/16/2022 Target Resolution Date: 08/15/2022 Goal Status: Active Ulcer/skin breakdown will have a volume reduction of 80% by week 12 Date Initiated: 06/16/2022 Target Resolution Date: 09/14/2022 Goal Status: Active Ulcer/skin breakdown will heal within 14 weeks Date Initiated: 06/16/2022 Target Resolution Date: 10/15/2022 Goal Status: Active Interventions: Assess patient/caregiver ability to obtain necessary supplies Assess patient/caregiver ability to perform ulcer/skin care regimen upon admission and as needed Assess ulceration(s) every visit Notes: Electronic  Signature(s) Signed: 06/30/2022 3:25:33 PM By: Levora Dredge Entered By: Levora Dredge on 06/30/2022 11:03:34 -------------------------------------------------------------------------------- Pain Assessment Details Patient Name: Date of Service: CORENTHIA, ELLERY 06/30/2022 10:00 A M Medical Record Number: [id number] Patient Account Number: [id number] Date of Birth/Sex: Treating RN: 07/18/1950 (72 y.o. [name] Primary Care Levon Penning: Early Osmond Other Clinician: Referring Essica Kiker: Treating Marijane Trower/Extender: Young Berry, Rinka Weeks in Treatment: 2 Active Problems Location of Pain Severity and Description of Pain Patient Has Paino Yes Site Locations Rate the pain. Current Pain Level: 5 Pain Management and Medication Current Pain Management: Notes pain at wound site left elbow EILIS, CAROLLO ([id number]) [id number] Page 7 of 9 Electronic Signature(s) Signed: 06/30/2022 3:25:33 PM By: Levora Dredge Entered By: Levora Dredge on 06/30/2022 10:18:15 -------------------------------------------------------------------------------- Patient/Caregiver Education Details Patient Name: Date of Service: Darrick Penna 3/11/2024andnbsp10:00 [place] Record Number: [id number] Patient Account Number: [id number] Date of Birth/Gender: Treating RN: [birth year] (72 y.o. Drema Dallas, Urban Gibson  Primary Care Physician: Early Osmond Other Clinician: Referring Physician: Treating Physician/Extender: Kendrick Fries in Treatment: 2 Education Assessment Education Provided To: Patient and Caregiver Education Topics Provided Wound/Skin Impairment: Handouts: Caring for Your Ulcer Methods: Explain/Verbal Responses: State content correctly Electronic Signature(s) Signed: 06/30/2022 3:25:33 PM By: Levora Dredge Entered By: Levora Dredge on 06/30/2022  11:03:45 -------------------------------------------------------------------------------- Wound Assessment Details Patient Name: Date of Service: LAURAANN, SHIBUYA 06/30/2022 10:00 A M Medical Record Number: [id number] Patient Account Number: [id number] Date of Birth/Sex: Treating RN: 10/17/1950 (72 y.o. [name] Primary Care Adelyna Brockman: Early Osmond Other Clinician: Referring Bresha Hosack: Treating Charley Lafrance/Extender: Young Berry, Rinka Weeks in Treatment: 2 Wound Status Wound Number: 1 Primary Etiology: Lesion Wound Location: Left Elbow Wound Status: Open Wounding Event: Trauma Date Acquired: 04/21/2022 Weeks Of Treatment: 2 Clustered Wound: No SKYLIE, DOBRANSKY ([id number]) [id number] Page 8 of 9 Photos Wound Measurements Length: (cm) 1.1 Width: (cm) 0.5 Depth: (cm) 0.3 Area: (cm) 0.432 Volume: (cm) 0.13 % Reduction in Area: -22.4% % Reduction in Volume: 38.7% Epithelialization: None Tunneling: No Undermining: Yes Starting Position (o'clock): 12 Ending Position (o'clock): 12 Maximum Distance: (cm) 1.2 Wound Description Classification: Full Thickness Without Exposed Support Structures Exudate Amount: Medium Exudate Type: Serosanguineous Exudate Color: red, brown Foul Odor After Cleansing: No Slough/Fibrino Yes Wound Bed Granulation Amount: Medium (34-66%) Exposed Structure Granulation Quality: Red, Pink Fascia Exposed: No Necrotic Amount: Medium (34-66%) Fat Layer (Subcutaneous Tissue) Exposed: Yes Necrotic Quality: Adherent Slough Tendon Exposed: No Muscle Exposed: No Joint Exposed: No Bone Exposed: No Treatment Notes Wound #1 (Elbow) Wound Laterality: Left Cleanser Byram Ancillary Kit - 15 Day Supply Discharge Instruction: Use supplies as instructed; Kit contains: (15) Saline Bullets; (15) 3x3 Gauze; 15 pr Gloves Soap and Water Discharge Instruction: Gently cleanse wound with antibacterial soap, rinse and pat  dry prior to dressing wounds Wound Cleanser Discharge Instruction: Wash your hands with soap and water. Remove old dressing, discard into plastic bag and place into trash. Cleanse the wound with Wound Cleanser prior to applying a clean dressing using gauze sponges, not tissues or cotton balls. Do not scrub or use excessive force. Pat dry using gauze sponges, not tissue or cotton balls. Peri-Wound Care Topical Primary Dressing Gauze Discharge Instruction: As directed: moistened with vashe solution Secondary Dressing ABD Pad 5x9 (in/in) Discharge Instruction: Cover with ABD pad Gauze Discharge Instruction: As directed: dry Secured With Stretch Net Dressing, Latex-free, Size 5, Small-Head / Shoulder / Thigh Compression Wrap Compression Stockings CARLENA, BROMMER ([id number]) [id number] Page 9 of 9 Add-Ons Electronic Signature(s) Signed: 06/30/2022 3:25:33 PM By: Levora Dredge Entered By: Levora Dredge on 06/30/2022 11:11:05 -------------------------------------------------------------------------------- Vitals Details Patient Name: Date of Service: Cheryl Coss D. 06/30/2022 10:00 A M Medical Record Number: [id number] Patient Account Number: [id number] Date of Birth/Sex: Treating RN: [birth year] (72 y.o. [name] Primary Care Taji Sather: Early Osmond Other Clinician: Referring Tawnie Ehresman: Treating Braylan Faul/Extender: Young Berry, Rinka Weeks in Treatment: 2 Vital Signs Time Taken: 10:17 Temperature (F): 97.5 Height (in): 66 Pulse (bpm): 102 Weight (lbs): 150 Respiratory Rate (breaths/min): 18 Body Mass Index (BMI): 24.2 Blood Pressure (mmHg): 173/71 Reference Range: 80 - 120 mg / dl Notes per husband states white coat syndrome, patient asymptomatic Electronic Signature(s) Signed: 06/30/2022 3:25:33 PM By: Levora Dredge Entered By: Levora Dredge on 06/30/2022 10:18:01

## 2022-06-30 NOTE — Progress Notes (Signed)
Cheryl Melendez ([id number]) [id number] Page 1 of 6 Visit Report for 06/30/2022 Chief Complaint Document Details Patient Name: Date of Service: Cheryl Melendez, Cheryl Melendez 06/30/2022 10:00 A M Medical Record Number: [id number] Patient Account Number: [id number] Date of Birth/Sex: Treating RN: Dec 29, 1950 (72 y.o. [name] Primary Care Provider: Early Osmond Other Clinician: Referring Provider: Treating Provider/Extender: Young Berry, Rinka Weeks in Treatment: 2 Information Obtained from: Patient Chief Complaint Left elbow laceration/skin tear Electronic Signature(s) Signed: 06/30/2022 1:05:58 PM By: Kalman Shan DO Entered By: Kalman Shan on 06/30/2022 11:49:19 -------------------------------------------------------------------------------- HPI Details Patient Name: Date of Service: Cheryl Coss D. 06/30/2022 10:00 A M Medical Record Number: [id number] Patient Account Number: [id number] Date of Birth/Sex: Treating RN: 1950/06/07 (72 y.o. [name] Primary Care Provider: Early Osmond Other Clinician: Referring Provider: Treating Provider/Extender: Kendrick Fries in Treatment: 2 History of Present Illness HPI Description: 06-16-2022 upon evaluation today patient presents today for an initial evaluation in our clinic concerning a left elbow injury which occurred actually on or around the beginning of January 2024. She actually had an x-ray which was negative for fracture on 05-02-2022. Subsequently however she has had an open wound since that time and there has been some speak of infection. She was actually on Augmentin and Cipro which was completed 2 weeks ago. She has been doing wet-to-dry dressings at this point. With that being said the wound is still very open and she continues to have falls which unfortunately is not helping either. I do not see any signs of active infection at this time which is  great news I do not think she requires any antibiotics as of today. The patient has a history mainly of just posttraumatic stress disorder which unfortunately is quite severe and is affecting her in all manners as far as her walking is concerned she also has spasms and has very Parkinson-like symptoms which are all attributed to the PTSD. With that being said this also causes her to fall at times which is where this injury came from and she has had other injuries where she is fallen and hurt herself as well. We discussed the possibility of using elbow protectors such as skaters would use in order to pad the area over the elbow I think this could actually be of benefit for her. 3/4; second visit for this patient who suffered a traumatic wound to the left elbow. She received 2 different courses of antibiotics I am not sure of the benefit of the antibiotics in any case she is off these now. We have been using Prisma and sit to vet. The wound is actually quite deep but does not probe to bone 3/11; patient presents for follow-up. She has been using collagen to the wound bed. She is scheduled to see plastic surgery later this month for wound evaluation. Cheryl Melendez ([id number]) [id number] Page 2 of 6 Electronic Signature(s) Signed: 06/30/2022 1:05:58 PM By: Kalman Shan DO Entered By: Kalman Shan on 06/30/2022 11:49:55 -------------------------------------------------------------------------------- Physical Exam Details Patient Name: Date of Service: Cheryl Coss D. 06/30/2022 10:00 A M Medical Record Number: [id number] Patient Account Number: [id number] Date of Birth/Sex: Treating RN: Mar 19, 1951 (72 y.o. [name] Primary Care Provider: Early Osmond Other Clinician: Referring Provider: Treating Provider/Extender: Young Berry, Rinka Weeks in Treatment: 2 Constitutional . Cardiovascular . Psychiatric . Notes T the left elbow  there is an open wound with increased depth. Nonviable tissue noted with some granulation tissue noted. No obvious signs  of surrounding soft o tissue infection. Overall wound bed does not appear healthy. Electronic Signature(s) Signed: 06/30/2022 1:05:58 PM By: Kalman Shan DO Entered By: Kalman Shan on 06/30/2022 11:50:50 -------------------------------------------------------------------------------- Physician Orders Details Patient Name: Date of Service: Cheryl Coss D. 06/30/2022 10:00 A M Medical Record Number: [id number] Patient Account Number: [id number] Date of Birth/Sex: Treating RN: Dec 05, 1950 (72 y.o. [name] Primary Care Provider: Early Osmond Other Clinician: Referring Provider: Treating Provider/Extender: Kendrick Fries in Treatment: 2 Verbal / Phone Orders: No Diagnosis Coding Follow-up Appointments Return Appointment in 1 week. [address] Cheryl Melendez D ([id number]) [phone] Page 3 of 6 Wash wounds with antibacterial soap and water. Anesthetic (Use 'Patient Medications' Section for Anesthetic Order Entry) Lidocaine applied to wound bed Wound Treatment Wound #1 - Elbow Wound Laterality: Left Cleanser: Byram Ancillary Kit - 15 Day Supply (Generic) 2 x Per Day/30 Days Discharge Instructions: Use supplies as instructed; Kit contains: (15) Saline Bullets; (15) 3x3 Gauze; 15 pr Gloves Cleanser: Soap and Water 2 x Per Day/30 Days Discharge Instructions: Gently cleanse wound with antibacterial soap, rinse and pat dry prior to dressing wounds Cleanser: Wound Cleanser 2 x Per Day/30 Days Discharge Instructions: Wash your hands with soap and water. Remove old dressing, discard into plastic bag and place into trash. Cleanse the wound with Wound Cleanser prior to applying a clean dressing using gauze sponges, not tissues or cotton balls. Do not scrub or use excessive force. Pat dry using  gauze sponges, not tissue or cotton balls. Prim Dressing: Gauze 2 x Per Day/30 Days ary Discharge Instructions: As directed: moistened with vashe solution Secondary Dressing: ABD Pad 5x9 (in/in) 2 x Per Day/30 Days Discharge Instructions: Cover with ABD pad Secondary Dressing: Gauze 2 x Per Day/30 Days Discharge Instructions: As directed: dry Secured With: Stretch Net Dressing, Latex-free, Size 5, Small-Head / Shoulder / Thigh 2 x Per Day/30 Days Laboratory Bacteria identified in Wound by Culture (MICRO) - left elbow, increased drainage, infection LOINC Code: [id number] Convenience Name: Wound culture routine Electronic Signature(s) Signed: 06/30/2022 1:05:58 PM By: Kalman Shan DO Entered By: Kalman Shan on 06/30/2022 11:55:08 -------------------------------------------------------------------------------- Problem List Details Patient Name: Date of Service: Cheryl Coss D. 06/30/2022 10:00 A M Medical Record Number: [id number] Patient Account Number: [id number] Date of Birth/Sex: Treating RN: Apr 18, 1951 (72 y.o. [name] Primary Care Provider: Early Osmond Other Clinician: Referring Provider: Treating Provider/Extender: Young Berry, Rinka Weeks in Treatment: 2 Active Problems ICD-10 Encounter Code Description Active Date MDM Diagnosis S51.012A Laceration without foreign body of left elbow, initial encounter 06/16/2022 No Yes F43.12 Post-traumatic stress disorder, chronic 06/16/2022 No Yes KALIEGH, ARMENT ([id number]) [id number] Page 4 of 6 Inactive Problems Resolved Problems Electronic Signature(s) Signed: 06/30/2022 1:05:58 PM By: Kalman Shan DO Entered By: Kalman Shan on 06/30/2022 11:49:15 -------------------------------------------------------------------------------- Progress Note Details Patient Name: Date of Service: Cheryl Coss D. 06/30/2022 10:00 A M Medical Record Number: [id number] Patient  Account Number: [id number] Date of Birth/Sex: Treating RN: November 29, 1950 (72 y.o. [name] Primary Care Provider: Early Osmond Other Clinician: Referring Provider: Treating Provider/Extender: Kendrick Fries in Treatment: 2 Subjective Chief Complaint Information obtained from Patient Left elbow laceration/skin tear History of Present Illness (HPI) 06-16-2022 upon evaluation today patient presents today for an initial evaluation in our clinic concerning a left elbow injury which occurred actually on or around the beginning of January 2024. She actually had an x-ray which was negative for fracture on 05-02-2022. Subsequently however she  has had an open wound since that time and there has been some speak of infection. She was actually on Augmentin and Cipro which was completed 2 weeks ago. She has been doing wet-to-dry dressings at this point. With that being said the wound is still very open and she continues to have falls which unfortunately is not helping either. I do not see any signs of active infection at this time which is great news I do not think she requires any antibiotics as of today. The patient has a history mainly of just posttraumatic stress disorder which unfortunately is quite severe and is affecting her in all manners as far as her walking is concerned she also has spasms and has very Parkinson-like symptoms which are all attributed to the PTSD. With that being said this also causes her to fall at times which is where this injury came from and she has had other injuries where she is fallen and hurt herself as well. We discussed the possibility of using elbow protectors such as skaters would use in order to pad the area over the elbow I think this could actually be of benefit for her. 3/4; second visit for this patient who suffered a traumatic wound to the left elbow. She received 2 different courses of antibiotics I am not sure of the benefit of the  antibiotics in any case she is off these now. We have been using Prisma and sit to vet. The wound is actually quite deep but does not probe to bone 3/11; patient presents for follow-up. She has been using collagen to the wound bed. She is scheduled to see plastic surgery later this month for wound evaluation. Objective Constitutional Vitals Time Taken: 10:17 AM, Height: 66 in, Weight: 150 lbs, BMI: 24.2, Temperature: 97.5 F, Pulse: 102 bpm, Respiratory Rate: 18 breaths/min, Blood Pressure: 173/71 mmHg. General Notes: per husband states white coat syndrome, patient asymptomatic General Notes: T the left elbow there is an open wound with increased depth. Nonviable tissue noted with some granulation tissue noted. No obvious signs of o surrounding soft tissue infection. Overall wound bed does not appear healthy. Integumentary (Hair, Skin) Wound #1 status is Open. Original cause of wound was Trauma. The date acquired was: 04/21/2022. The wound has been in treatment 2 weeks. The wound is located on the Left Elbow. The wound measures 1.1cm length x 0.5cm width x 0.3cm depth; 0.432cm^2 area and 0.13cm^3 volume. There is Fat LAURA, BALBUENA ([id number]) [id number] Page 5 of 6 (Subcutaneous Tissue) exposed. There is no tunneling noted, however, there is undermining starting at 12:00 and ending at 12:00 with a maximum distance of 1.2cm. There is a medium amount of serosanguineous drainage noted. There is medium (34-66%) red, pink granulation within the wound bed. There is a medium (34-66%) amount of necrotic tissue within the wound bed including Adherent Slough. Assessment Active Problems ICD-10 Laceration without foreign body of left elbow, initial encounter Post-traumatic stress disorder, chronic Patient's wound is stable. Overall wound bed does not appear healthy and she has some yellow thickened drainage. No obvious signs of surrounding soft tissue infection however  I obtained a deep tissue culture. For now I recommended switching the dressing to Vashe wet-to-dry. Pending results of the culture she would benefit from likely oral antibiotics and Keystone antibiotic ointment. Follow-up in 1 week. Recommended she keep her plastic surgery appointment. Plan Follow-up Appointments: Return Appointment in 1 week. Bathing/ Shower/ Hygiene: Wash wounds with antibacterial soap and water. Anesthetic (Use 'Patient Medications'  Section for Anesthetic Order Entry): Lidocaine applied to wound bed Laboratory ordered were: Wound culture routine - left elbow, increased drainage, infection WOUND #1: - Elbow Wound Laterality: Left Cleanser: Byram Ancillary Kit - 15 Day Supply (Generic) 2 x Per Day/30 Days Discharge Instructions: Use supplies as instructed; Kit contains: (15) Saline Bullets; (15) 3x3 Gauze; 15 pr Gloves Cleanser: Soap and Water 2 x Per Day/30 Days Discharge Instructions: Gently cleanse wound with antibacterial soap, rinse and pat dry prior to dressing wounds Cleanser: Wound Cleanser 2 x Per Day/30 Days Discharge Instructions: Wash your hands with soap and water. Remove old dressing, discard into plastic bag and place into trash. Cleanse the wound with Wound Cleanser prior to applying a clean dressing using gauze sponges, not tissues or cotton balls. Do not scrub or use excessive force. Pat dry using gauze sponges, not tissue or cotton balls. Prim Dressing: Gauze 2 x Per Day/30 Days ary Discharge Instructions: As directed: moistened with vashe solution Secondary Dressing: ABD Pad 5x9 (in/in) 2 x Per Day/30 Days Discharge Instructions: Cover with ABD pad Secondary Dressing: Gauze 2 x Per Day/30 Days Discharge Instructions: As directed: dry Secured With: Stretch Net Dressing, Latex-free, Size 5, Small-Head / Shoulder / Thigh 2 x Per Day/30 Days 1. Vashe wet-to-dry dressings 2. wound Culture 3. Follow-up in 1 week Electronic Signature(s) Signed: 06/30/2022  1:05:58 PM By: Kalman Shan DO Entered By: Kalman Shan on 06/30/2022 11:54:54 -------------------------------------------------------------------------------- ROS/PFSH Details Patient Name: Date of Service: Cheryl Coss D. 06/30/2022 10:00 A M Medical Record Number: [id number] Patient Account Number: [id number] Date of Birth/Sex: Treating RN: 28-Aug-1950 (72 y.o. [name] Primary Care Provider: Early Osmond Other Clinician: GUDIYA, VARDEMAN ([id number]) [id number] Page 6 of 6 Referring Provider: Treating Provider/Extender: Young Berry, Rinka Weeks in Treatment: 2 Immunizations Pneumococcal Vaccine: Received Pneumococcal Vaccination: No Implantable Devices None Family and Social History Never smoker; Marital Status - Married; Alcohol Use: Never; Drug Use: No History; Caffeine Use: Daily Electronic Signature(s) Signed: 06/30/2022 1:05:58 PM By: Kalman Shan DO Signed: 06/30/2022 3:25:33 PM By: Levora Dredge Entered By: Kalman Shan on 06/30/2022 11:56:13 -------------------------------------------------------------------------------- SuperBill Details Patient Name: Date of Service: Cheryl Coss D. 06/30/2022 Medical Record Number: [id number] Patient Account Number: [id number] Date of Birth/Sex: Treating RN: Jun 23, 1950 (71 y.o. [name] Primary Care Provider: Early Osmond Other Clinician: Referring Provider: Treating Provider/Extender: Young Berry, Rinka Weeks in Treatment: 2 Diagnosis Coding ICD-10 Codes Code Description [id number] Laceration without foreign body of left elbow, initial encounter F43.12 Post-traumatic stress disorder, chronic Facility Procedures : CPT4 Code: [id number] Description: [id number] - WOUND CARE VISIT-LEV 3 EST PT Modifier: Quantity: 1 Physician Procedures : CPT4 Code Description Modifier [id number] - WC PHYS LEVEL 3 - EST PT ICD-10 Diagnosis Description  [id number] Laceration without foreign body of left elbow, initial encounter F43.12 Post-traumatic stress disorder, chronic Quantity: 1 Electronic Signature(s) Signed: 06/30/2022 1:05:58 PM By: Kalman Shan DO Entered By: Kalman Shan on 06/30/2022 11:54:58

## 2022-07-01 DIAGNOSIS — I10 Essential (primary) hypertension: Secondary | ICD-10-CM | POA: Diagnosis not present

## 2022-07-01 DIAGNOSIS — R42 Dizziness and giddiness: Secondary | ICD-10-CM | POA: Diagnosis not present

## 2022-07-01 DIAGNOSIS — R7301 Impaired fasting glucose: Secondary | ICD-10-CM | POA: Diagnosis not present

## 2022-07-01 DIAGNOSIS — S51002D Unspecified open wound of left elbow, subsequent encounter: Secondary | ICD-10-CM | POA: Diagnosis not present

## 2022-07-01 DIAGNOSIS — R296 Repeated falls: Secondary | ICD-10-CM | POA: Diagnosis not present

## 2022-07-01 DIAGNOSIS — Z0000 Encounter for general adult medical examination without abnormal findings: Secondary | ICD-10-CM | POA: Diagnosis not present

## 2022-07-01 DIAGNOSIS — Z8352 Family history of ear disorders: Secondary | ICD-10-CM | POA: Diagnosis not present

## 2022-07-01 DIAGNOSIS — F419 Anxiety disorder, unspecified: Secondary | ICD-10-CM | POA: Diagnosis not present

## 2022-07-01 DIAGNOSIS — Z1159 Encounter for screening for other viral diseases: Secondary | ICD-10-CM | POA: Diagnosis not present

## 2022-07-01 DIAGNOSIS — R2689 Other abnormalities of gait and mobility: Secondary | ICD-10-CM | POA: Diagnosis not present

## 2022-07-01 DIAGNOSIS — G259 Extrapyramidal and movement disorder, unspecified: Secondary | ICD-10-CM | POA: Diagnosis not present

## 2022-07-01 DIAGNOSIS — F331 Major depressive disorder, recurrent, moderate: Secondary | ICD-10-CM | POA: Diagnosis not present

## 2022-07-02 ENCOUNTER — Encounter: Payer: Self-pay | Admitting: Neurology

## 2022-07-03 LAB — AEROBIC CULTURE W GRAM STAIN (SUPERFICIAL SPECIMEN)

## 2022-07-07 ENCOUNTER — Ambulatory Visit: Payer: Medicare PPO | Admitting: Physician Assistant

## 2022-07-09 ENCOUNTER — Ambulatory Visit: Payer: Medicare PPO | Admitting: Neurology

## 2022-07-14 ENCOUNTER — Encounter: Payer: Medicare PPO | Admitting: Physician Assistant

## 2022-07-14 DIAGNOSIS — F4312 Post-traumatic stress disorder, chronic: Secondary | ICD-10-CM | POA: Diagnosis not present

## 2022-07-14 DIAGNOSIS — I10 Essential (primary) hypertension: Secondary | ICD-10-CM | POA: Diagnosis not present

## 2022-07-14 DIAGNOSIS — S51012A Laceration without foreign body of left elbow, initial encounter: Secondary | ICD-10-CM | POA: Diagnosis not present

## 2022-07-14 NOTE — Progress Notes (Addendum)
MARQUISHA, SWEEN D ([id number]) [id number] Page 1 of 5 Visit Report for 07/14/2022 Chief Complaint Document Details Patient Name: Date of Service: Cheryl Melendez, Cheryl Melendez 07/14/2022 10:30 A M Medical Record Number: [id number] Patient Account Number: [id number] Date of Birth/Sex: Treating RN: [birth year] (72 y.o. [name] Primary Care Provider: Early Osmond Other Clinician: Referring Provider: Treating Provider/Extender: Norlene Duel, Rinka Weeks in Treatment: 4 Information Obtained from: Patient Chief Complaint Left elbow laceration/skin tear Electronic Signature(s) Signed: 07/14/2022 10:47:03 AM By: Worthy Keeler PA-C Entered By: Worthy Keeler on 07/14/2022 10:47:03 -------------------------------------------------------------------------------- HPI Details Patient Name: Date of Service: Cheryl Coss D. 07/14/2022 10:30 A M Medical Record Number: [id number] Patient Account Number: [id number] Date of Birth/Sex: Treating RN: Oct 22, 1950 (72 y.o. [name] Primary Care Provider: Early Osmond Other Clinician: Referring Provider: Treating Provider/Extender: Norlene Duel, Rinka Weeks in Treatment: 4 History of Present Illness HPI Description: 06-16-2022 upon evaluation today patient presents today for an initial evaluation in our clinic concerning a left elbow injury which occurred actually on or around the beginning of January 2024. She actually had an x-ray which was negative for fracture on 05-02-2022. Subsequently however she has had an open wound since that time and there has been some speak of infection. She was actually on Augmentin and Cipro which was completed 2 weeks ago. She has been doing wet-to-dry dressings at this point. With that being said the wound is still very open and she continues to have falls which unfortunately is not helping either. I do not see any signs of active infection at this time which is great news I  do not think she requires any antibiotics as of today. The patient has a history mainly of just posttraumatic stress disorder which unfortunately is quite severe and is affecting her in all manners as far as her walking is concerned she also has spasms and has very Parkinson-like symptoms which are all attributed to the PTSD. With that being said this also causes her to fall at times which is where this injury came from and she has had other injuries where she is fallen and hurt herself as well. We discussed the possibility of using elbow protectors such as skaters would use in order to pad the area over the elbow I think this could actually be of benefit for her. 3/4; second visit for this patient who suffered a traumatic wound to the left elbow. She received 2 different courses of antibiotics I am not sure of the benefit of the antibiotics in any case she is off these now. We have been using Prisma and sit to vet. The wound is actually quite deep but does not probe to bone 3/11; patient presents for follow-up. She has been using collagen to the wound bed. She is scheduled to see plastic surgery later this month for wound evaluation. 07-14-2022 upon evaluation today patient appears to be doing about the same in regard to her wound on the elbow. This does look a little better internally but signs wise is not significantly different. She actually has an appointment tomorrow with the plastic surgeon. If this is something they feel like they can heal I would recommend that she go forward with that to be honest. Electronic Signature(s) Signed: 07/15/2022 6:50:43 PM By: Worthy Keeler PA-C Previous Signature: 07/15/2022 6:48:53 PM Version By: Worthy Keeler PA-C Entered By: Worthy Keeler on 07/15/2022 18:50:42 -------------------------------------------------------------------------------- Physical Exam Details Patient Name: Date of Service: Cheryl Melendez, Cheryl D. 07/14/2022  10:30 A M Medical Record  Number: [id number] Patient Account Number: [id number] Date of Birth/Sex: Treating RN: 05/31/1950 (72 y.o. [name] Primary Care Provider: Early Osmond Other Clinician: Referring Provider: Treating Provider/Extender: Norlene Duel, Rinka Weeks in Treatment: [address] D ([id number]) [id number] Page 2 of 5 Constitutional Well-nourished and well-hydrated in no acute distress. Respiratory normal breathing without difficulty. Psychiatric this patient is able to make decisions and demonstrates good insight into disease process. Alert and Oriented x 3. pleasant and cooperative. Notes Upon inspection patient's wound again does not appear to be doing poorly but I am still unsure how long this can take this to actually close if we were to let this continue down the current path I think if there is a quick and easy way that plastic surgery can close this this more quickly for her that would be the way that suggest going. I discussed that with her today. Electronic Signature(s) Signed: 07/15/2022 6:51:13 PM By: Worthy Keeler PA-C Previous Signature: 07/15/2022 6:51:01 PM Version By: Worthy Keeler PA-C Entered By: Worthy Keeler on 07/15/2022 18:51:13 -------------------------------------------------------------------------------- Physician Orders Details Patient Name: Date of Service: Cheryl Coss D. 07/14/2022 10:30 A M Medical Record Number: [id number] Patient Account Number: [id number] Date of Birth/Sex: Treating RN: Sep 18, 1950 (72 y.o. [name] Primary Care Provider: Early Osmond Other Clinician: Referring Provider: Treating Provider/Extender: Norlene Duel, Rinka Weeks in Treatment: 4 Verbal / Phone Orders: No Diagnosis Coding ICD-10 Coding Code Description [id number] Laceration without foreign body of left elbow, initial encounter F43.12 Post-traumatic stress disorder, chronic Follow-up Appointments Return  Appointment in 1 week. Bathing/ [employer] wounds with antibacterial soap and water. Anesthetic (Use 'Patient Medications' Section for Anesthetic Order Entry) Lidocaine applied to wound bed Wound Treatment Wound #1 - Elbow Wound Laterality: Left Cleanser: Byram Ancillary Kit - 15 Day Supply (Generic) 2 x Per Day/30 Days Discharge Instructions: Use supplies as instructed; Kit contains: (15) Saline Bullets; (15) 3x3 Gauze; 15 pr Gloves Cleanser: Soap and Water 2 x Per Day/30 Days Discharge Instructions: Gently cleanse wound with antibacterial soap, rinse and pat dry prior to dressing wounds Cleanser: Wound Cleanser 2 x Per Day/30 Days Discharge Instructions: Wash your hands with soap and water. Remove old dressing, discard into plastic bag and place into trash. Cleanse the wound with Wound Cleanser prior to applying a clean dressing using gauze sponges, not tissues or cotton balls. Do not scrub or use excessive force. Pat dry using gauze sponges, not tissue or cotton balls. Prim Dressing: Gauze 2 x Per Day/30 Days ary Discharge Instructions: As directed: moistened with vashe solution Secondary Dressing: ABD Pad 5x9 (in/in) 2 x Per Day/30 Days Discharge Instructions: Cover with ABD pad Secondary Dressing: Gauze 2 x Per Day/30 Days Discharge Instructions: As directed: dry Secured With: Stretch Net Dressing, Latex-free, Size 5, Small-Head / Shoulder / Thigh 2 x Per Day/30 Days Cheryl Melendez, Cheryl Melendez ([id number]) [phone].pdf Page 3 of 5 Electronic Signature(s) Signed: 07/14/2022 4:12:26 PM By: Levora Dredge Signed: 07/14/2022 4:46:02 PM By: Worthy Keeler PA-C Entered By: Levora Dredge on 07/14/2022 11:27:36 -------------------------------------------------------------------------------- Problem List Details Patient Name: Date of Service: Cheryl Coss D. 07/14/2022 10:30 A M Medical Record Number: [id number] Patient Account Number: [id number] Date of  Birth/Sex: Treating RN: [birth year] (72 y.o. [name] Primary Care Provider: Early Osmond Other Clinician: Referring Provider: Treating Provider/Extender: Norlene Duel, Rinka Weeks in Treatment: 4 Active Problems ICD-10 Encounter Code Description Active Date MDM Diagnosis S51.012A Laceration  without foreign body of left elbow, initial encounter 06/16/2022 No Yes F43.12 Post-traumatic stress disorder, chronic 06/16/2022 No Yes Inactive Problems Resolved Problems Electronic Signature(s) Signed: 07/14/2022 10:47:01 AM By: Worthy Keeler PA-C Entered By: Worthy Keeler on 07/14/2022 10:47:00 -------------------------------------------------------------------------------- Progress Note Details Patient Name: Date of Service: Cheryl Coss D. 07/14/2022 10:30 A M Medical Record Number: [id number] Patient Account Number: [id number] Date of Birth/Sex: Treating RN: Oct 09, 1950 (72 y.o. [name] Primary Care Provider: Early Osmond Other Clinician: Referring Provider: Treating Provider/Extender: Norlene Duel, Rinka Weeks in Treatment: 4 Subjective Chief Complaint Information obtained from Patient Left elbow laceration/skin tear History of Present Illness (HPI) 06-16-2022 upon evaluation today patient presents today for an initial evaluation in our clinic concerning a left elbow injury which occurred actually on or around the beginning of January 2024. She actually had an x-ray which was negative for fracture on 05-02-2022. Subsequently however she has had an open wound since that time and there has been some speak of infection. She was actually on Augmentin and Cipro which was completed 2 weeks ago. She has been doing wet-to-dry dressings at this point. With that being said the wound is still very open and she continues to have falls which unfortunately is not helping either. I do not see any signs of active infection at this time which is great news I do  not think she requires any antibiotics as of today. The patient has a history mainly of just posttraumatic stress disorder which unfortunately is quite severe and is affecting her in all manners as far as her walking is concerned she also has spasms and has very Parkinson-like symptoms which are all attributed to the PTSD. With that being said this also causes her to fall at times which is where this injury came from and she has had other injuries where she is fallen and hurt herself as well. We discussed the possibility of using elbow protectors such as skaters would use in order to pad the area over the elbow I think this could actually be of benefit for her. 3/4; second visit for this patient who suffered a traumatic wound to the left elbow. She received 2 different courses of antibiotics I am not sure of the benefit of the antibiotics in any case she is off these now. We have been using Prisma and sit to vet. The wound is actually quite deep but does not probe to bone 3/11; patient presents for follow-up. She has been using collagen to the wound bed. She is scheduled to see plastic surgery later this month for wound evaluation. 07-14-2022 upon evaluation today patient appears to be doing about the same in regard to her wound on the elbow. This does look a little better internally but Cheryl Melendez, Cheryl Melendez ([id number]) [phone].pdf Page 4 of 5 signs wise is not significantly different. She actually has an appointment tomorrow with the plastic surgeon. If this is something they feel like they can heal I would recommend that she go forward with that to be honest. Objective Constitutional Well-nourished and well-hydrated in no acute distress. Vitals Time Taken: 10:33 AM, Height: 66 in, Weight: 150 lbs, BMI: 24.2, Temperature: 98.1 F, Pulse: 105 bpm, Respiratory Rate: 18 breaths/min, Blood Pressure: 171/69 mmHg. Respiratory normal breathing without  difficulty. Psychiatric this patient is able to make decisions and demonstrates good insight into disease process. Alert and Oriented x 3. pleasant and cooperative. General Notes: Upon inspection patient's wound again does not appear to be doing poorly  but I am still unsure how long this can take this to actually close if we were to let this continue down the current path I think if there is a quick and easy way that plastic surgery can close this this more quickly for her that would be the way that suggest going. I discussed that with her today. Integumentary (Hair, Skin) Wound #1 status is Open. Original cause of wound was Trauma. The date acquired was: 04/21/2022. The wound has been in treatment 4 weeks. The wound is located on the Left Elbow. The wound measures 1.2cm length x 0.5cm width x 0.3cm depth; 0.471cm^2 area and 0.141cm^3 volume. There is Fat Layer (Subcutaneous Tissue) exposed. There is no tunneling noted, however, there is undermining starting at 6:00 and ending at 7:00 with a maximum distance of 1cm. There is a medium amount of serosanguineous drainage noted. There is small (1-33%) red, pink granulation within the wound bed. There is a large (67- 100%) amount of necrotic tissue within the wound bed including Adherent Slough. Assessment Active Problems ICD-10 Laceration without foreign body of left elbow, initial encounter Post-traumatic stress disorder, chronic Plan Follow-up Appointments: Return Appointment in 1 week. Bathing/ Shower/ Hygiene: Wash wounds with antibacterial soap and water. Anesthetic (Use 'Patient Medications' Section for Anesthetic Order Entry): Lidocaine applied to wound bed WOUND #1: - Elbow Wound Laterality: Left Cleanser: Byram Ancillary Kit - 15 Day Supply (Generic) 2 x Per Day/30 Days Discharge Instructions: Use supplies as instructed; Kit contains: (15) Saline Bullets; (15) 3x3 Gauze; 15 pr Gloves Cleanser: Soap and Water 2 x Per Day/30  Days Discharge Instructions: Gently cleanse wound with antibacterial soap, rinse and pat dry prior to dressing wounds Cleanser: Wound Cleanser 2 x Per Day/30 Days Discharge Instructions: Wash your hands with soap and water. Remove old dressing, discard into plastic bag and place into trash. Cleanse the wound with Wound Cleanser prior to applying a clean dressing using gauze sponges, not tissues or cotton balls. Do not scrub or use excessive force. Pat dry using gauze sponges, not tissue or cotton balls. Prim Dressing: Gauze 2 x Per Day/30 Days ary Discharge Instructions: As directed: moistened with vashe solution Secondary Dressing: ABD Pad 5x9 (in/in) 2 x Per Day/30 Days Discharge Instructions: Cover with ABD pad Secondary Dressing: Gauze 2 x Per Day/30 Days Discharge Instructions: As directed: dry Secured With: Stretch Net Dressing, Latex-free, Size 5, Small-Head / Shoulder / Thigh 2 x Per Day/30 Days 1. I would recommend for the time being that the patient continue to monitor for any signs of infection or worsening. Obviously if anything changes she should let me know. 2. I am going to suggest as well that she continue with the ABD pad to cover after the [employer]'s wet-to-dry gauze is put in. 3. She is seeing plastic surgery tomorrow obviously if there is a quick and easy way they think they can going to close the surgically I think that would be best as elbows are very difficult areas to heal to be honest. We will see patient back for reevaluation in 1 week here in the clinic. If anything worsens or changes patient will contact our office for additional Cheryl Melendez, Cheryl Melendez ([id number]) [phone].pdf Page 5 of 5 recommendations. Electronic Signature(s) Signed: 07/15/2022 6:51:56 PM By: Worthy Keeler PA-C Entered By: Worthy Keeler on 07/15/2022 18:51:56 -------------------------------------------------------------------------------- SuperBill Details Patient Name:  Date of Service: Cheryl Coss D. 07/14/2022 Medical Record Number: [id number] Patient Account Number: [id number] Date of Birth/Sex: Treating RN: 06-Feb-1951 (  72 y.o. [name] Primary Care Provider: Early Osmond Other Clinician: Referring Provider: Treating Provider/Extender: Norlene Duel, Rinka Weeks in Treatment: 4 Diagnosis Coding ICD-10 Codes Code Description [phone] Laceration without foreign body of left elbow, initial encounter F43.12 Post-traumatic stress disorder, chronic Facility Procedures : CPT4 Code: [id number] Description: [id number] - WOUND CARE VISIT-LEV 3 EST PT Modifier: Quantity: 1 Physician Procedures : CPT4 Code Description Modifier [id number] - WC PHYS LEVEL 3 - EST PT ICD-10 Diagnosis Description [id number] Laceration without foreign body of left elbow, initial encounter F43.12 Post-traumatic stress disorder, chronic Quantity: 1 Electronic Signature(s) Signed: 07/15/2022 6:52:25 PM By: Worthy Keeler PA-C Previous Signature: 07/14/2022 12:31:15 PM Version By: Levora Dredge Previous Signature: 07/14/2022 4:46:02 PM Version By: Worthy Keeler PA-C Entered By: Worthy Keeler on 07/15/2022 18:52:25

## 2022-07-14 NOTE — Progress Notes (Signed)
Cheryl, Melendez Melendez ([id number]) [id number] Page 1 of 8 Visit Report for 07/14/2022 Arrival Information Details Patient Name: Date of Service: Cheryl, Melendez 07/14/2022 10:30 A M Medical Record Number: [id number] Patient Account Number: [id number] Date of Birth/Sex: Treating RN: 1951/02/11 (72 y.o. [name] Primary Care Melissia Lahman: Early Osmond Other Clinician: Referring Lovie Zarling: Treating Therese Rocco/Extender: Norlene Duel, Rinka Weeks in Treatment: 4 Visit Information History Since Last Visit Added or deleted any medications: Yes Patient Arrived: Cane Any new allergies or adverse reactions: No Arrival Time: 10:31 Had a fall or experienced change in No Accompanied By: husband activities of daily living that may affect Transfer Assistance: None risk of falls: Patient Identification Verified: Yes Hospitalized since last visit: No Secondary Verification Process Completed: Yes Has Dressing in Place as Prescribed: Yes Patient Requires Transmission-Based Precautions: No Pain Present Now: Yes Patient Has Alerts: No Electronic Signature(s) Signed: 07/14/2022 4:12:26 PM By: Levora Dredge Entered By: Levora Dredge on 07/14/2022 10:32:25 -------------------------------------------------------------------------------- Clinic Level of Care Assessment Details Patient Name: Date of Service: Cheryl, Melendez 07/14/2022 10:30 A M Medical Record Number: [id number] Patient Account Number: [id number] Date of Birth/Sex: Treating RN: Sep 07, 1950 (72 y.o. [name] Primary Care Martha Ellerby: Early Osmond Other Clinician: Referring Tysheena Ginzburg: Treating Arine Foley/Extender: Norlene Duel, Rinka Weeks in Treatment: 4 Clinic Level of Care Assessment Items TOOL 4 Quantity Score []  - 0 Use when only an EandM is performed on FOLLOW-UP visit ASSESSMENTS - Nursing Assessment / Reassessment X- 1 10 Reassessment of Co-morbidities (includes updates in  patient status) X- 1 5 Reassessment of Adherence to Treatment Plan ASSESSMENTS - Wound and Skin A ssessment / Reassessment X - Simple Wound Assessment / Reassessment - one wound 1 5 []  - 0 Complex Wound Assessment / Reassessment - multiple wounds []  - 0 Dermatologic / Skin Assessment (not related to wound area) ASSESSMENTS - Focused Assessment []  - 0 Circumferential Edema Measurements - multi extremities []  - 0 Nutritional Assessment / Counseling / Intervention []  - 0 Lower Extremity Assessment (monofilament, tuning fork, pulses) []  - 0 Peripheral Arterial Disease Assessment (using hand held doppler) ASSESSMENTS - Ostomy and/or Continence Assessment and Care []  - 0 Incontinence Assessment and Management []  - 0 Ostomy Care Assessment and Management (repouching, etc.) PROCESS - Coordination of Care X - Simple Patient / Family Education for ongoing care 1 15 []  - 0 Complex (extensive) Patient / Family Education for ongoing care Cheryl Melendez, Cheryl Melendez ([id number]) [phone] Page 2 of 8 []  - 0 Staff obtains Consents, Records, T Results / Process Orders est []  - 0 Staff telephones HHA, Nursing Homes / Clarify orders / etc []  - 0 Routine Transfer to another Facility (non-emergent condition) []  - 0 Routine Hospital Admission (non-emergent condition) []  - 0 New Admissions / Biomedical engineer / Ordering NPWT Apligraf, etc. , []  - 0 Emergency Hospital Admission (emergent condition) X- 1 10 Simple Discharge Coordination []  - 0 Complex (extensive) Discharge Coordination PROCESS - Special Needs []  - 0 Pediatric / Minor Patient Management []  - 0 Isolation Patient Management []  - 0 Hearing / Language / Visual special needs []  - 0 Assessment of Community assistance (transportation, Melendez/C planning, etc.) []  - 0 Additional assistance / Altered mentation []  - 0 Support Surface(s) Assessment (bed, cushion, seat, etc.) INTERVENTIONS - Wound Cleansing /  Measurement X - Simple Wound Cleansing - one wound 1 5 []  - 0 Complex Wound Cleansing - multiple wounds X- 1 5 Wound Imaging (photographs - any number of wounds) []  - 0  Wound Tracing (instead of photographs) X- 1 5 Simple Wound Measurement - one wound []  - 0 Complex Wound Measurement - multiple wounds INTERVENTIONS - Wound Dressings X - Small Wound Dressing one or multiple wounds 1 10 []  - 0 Medium Wound Dressing one or multiple wounds []  - 0 Large Wound Dressing one or multiple wounds X- 1 5 Application of Medications - topical []  - 0 Application of Medications - injection INTERVENTIONS - Miscellaneous []  - 0 External ear exam []  - 0 Specimen Collection (cultures, biopsies, blood, body fluids, etc.) []  - 0 Specimen(s) / Culture(s) sent or taken to Lab for analysis []  - 0 Patient Transfer (multiple staff / Civil Service fast streamer / Similar devices) []  - 0 Simple Staple / Suture removal (25 or less) []  - 0 Complex Staple / Suture removal (26 or more) []  - 0 Hypo / Hyperglycemic Management (close monitor of Blood Glucose) []  - 0 Ankle / Brachial Index (ABI) - do not check if billed separately X- 1 5 Vital Signs Has the patient been seen at the hospital within the last three years: Yes Total Score: 80 Level Of Care: New/Established - Level 3 Electronic Signature(s) Signed: 07/14/2022 4:12:26 PM By: Levora Dredge Entered By: Levora Dredge on 07/14/2022 12:31:09 Cheryl Melendez ([id number] Page 3 of 8 -------------------------------------------------------------------------------- Encounter Discharge Information Details Patient Name: Date of Service: Cheryl Melendez, Cheryl Melendez 07/14/2022 10:30 A M Medical Record Number: [id number] Patient Account Number: [id number] Date of Birth/Sex: Treating RN: 1951/03/28 (72 y.o. [name] Primary Care Masashi Snowdon: Early Osmond Other Clinician: Referring Hetty Linhart: Treating Ryland Smoots/Extender: Holley Dexter in Treatment: 4 Encounter Discharge Information Items Discharge Condition: Stable Ambulatory Status: Cane Discharge Destination: Home Transportation: Private Auto Accompanied By: spouse Schedule Follow-up Appointment: Yes Clinical Summary of Care: Electronic Signature(s) Signed: 07/14/2022 12:34:54 PM By: Levora Dredge Entered By: Levora Dredge on 07/14/2022 12:34:54 -------------------------------------------------------------------------------- Lower Extremity Assessment Details Patient Name: Date of Service: Cheryl Melendez, Cheryl Melendez 07/14/2022 10:30 A M Medical Record Number: [id number] Patient Account Number: [id number] Date of Birth/Sex: Treating RN: 09-03-1950 (72 y.o. [name] Primary Care Elois Averitt: Early Osmond Other Clinician: Referring Keene Gilkey: Treating Elektra Wartman/Extender: Norlene Duel, Rinka Weeks in Treatment: 4 Electronic Signature(s) Signed: 07/14/2022 4:12:26 PM By: Levora Dredge Entered By: Levora Dredge on 07/14/2022 10:41:54 -------------------------------------------------------------------------------- Multi Wound Chart Details Patient Name: Date of Service: Cheryl Coss Melendez. 07/14/2022 10:30 A M Medical Record Number: [id number] Patient Account Number: [id number] Date of Birth/Sex: Treating RN: July 21, 1950 (72 y.o. [name] Primary Care Draysen Weygandt: Early Osmond Other Clinician: Referring Zayanna Pundt: Treating Zayne Draheim/Extender: Norlene Duel, Rinka Weeks in Treatment: 4 Vital Signs Height(in): 66 Pulse(bpm): 105 Weight(lbs): 150 Blood Pressure(mmHg): 171/69 Body Mass Index(BMI): 24.2 Temperature(F): 98.1 Respiratory Rate(breaths/min): 18 [1:Photos:] [N/A:N/A] Left Elbow N/A N/A Wound Location: Trauma N/A N/A Wounding Event: Lesion N/A N/A Primary Etiology: AMARIYA, SIMER ([id number]) [phone].pdf Page 4 of 8 04/21/2022 N/A N/A Date Acquired: 4 N/A N/A Weeks of  Treatment: Open N/A N/A Wound Status: No N/A N/A Wound Recurrence: 1.2x0.5x0.3 N/A N/A Measurements L x W x Melendez (cm) 0.471 N/A N/A A (cm) : rea 0.141 N/A N/A Volume (cm) : -33.40% N/A N/A % Reduction in A rea: 33.50% N/A N/A % Reduction in Volume: 6 Starting Position 1 (o'clock): 7 Ending Position 1 (o'clock): 1 Maximum Distance 1 (cm): Yes N/A N/A Undermining: Full Thickness Without Exposed N/A N/A Classification: Support Structures Medium N/A N/A Exudate Amount: Serosanguineous N/A N/A Exudate Type: red, brown N/A N/A  Exudate Color: Small (1-33%) N/A N/A Granulation Amount: Red, Pink N/A N/A Granulation Quality: Large (67-100%) N/A N/A Necrotic Amount: Fat Layer (Subcutaneous Tissue): Yes N/A N/A Exposed Structures: Fascia: No Tendon: No Muscle: No Joint: No Bone: No None N/A N/A Epithelialization: Treatment Notes Electronic Signature(s) Signed: 07/14/2022 4:12:26 PM By: Levora Dredge Entered By: Levora Dredge on 07/14/2022 11:12:28 -------------------------------------------------------------------------------- [place] Details Patient Name: Date of Service: Cheryl Coss Melendez. 07/14/2022 10:30 A M Medical Record Number: [id number] Patient Account Number: [id number] Date of Birth/Sex: Treating RN: [birth year] (72 y.o. [name] Primary Care Fouad Taul: Early Osmond Other Clinician: Referring Paxtyn Boyar: Treating Markise Haymer/Extender: Norlene Duel, Rinka Weeks in Treatment: 4 Active Inactive Abuse / Safety / Falls / Self Care Management Nursing Diagnoses: Potential for injury related to falls Goals: Patient will remain injury free related to falls Date Initiated: 06/16/2022 Target Resolution Date: 07/15/2022 Goal Status: Active Interventions: Assess Activities of Daily Living upon admission and as needed Assess fall risk on admission and as needed Assess: immobility, friction, shearing, incontinence upon  admission and as needed Assess impairment of mobility on admission and as needed per policy Assess personal safety and home safety (as indicated) on admission and as needed Assess self care needs on admission and as needed Notes: Necrotic Tissue Nursing Diagnoses: Knowledge deficit related to management of necrotic/devitalized tissue Goals: Cheryl Melendez, Cheryl Melendez ([id number]) [phone].pdf Page 5 of 8 Necrotic/devitalized tissue will be minimized in the wound bed Date Initiated: 06/16/2022 Target Resolution Date: 07/15/2022 Goal Status: Active Interventions: Assess patient pain level pre-, during and post procedure and prior to discharge Provide education on necrotic tissue and debridement process Notes: Wound/Skin Impairment Nursing Diagnoses: Knowledge deficit related to ulceration/compromised skin integrity Goals: Patient/caregiver will verbalize understanding of skin care regimen Date Initiated: 06/16/2022 Date Inactivated: 06/30/2022 Target Resolution Date: 07/15/2022 Goal Status: Met Ulcer/skin breakdown will have a volume reduction of 30% by week 4 Date Initiated: 06/16/2022 Target Resolution Date: 07/15/2022 Goal Status: Active Ulcer/skin breakdown will have a volume reduction of 50% by week 8 Date Initiated: 06/16/2022 Target Resolution Date: 08/15/2022 Goal Status: Active Ulcer/skin breakdown will have a volume reduction of 80% by week 12 Date Initiated: 06/16/2022 Target Resolution Date: 09/14/2022 Goal Status: Active Ulcer/skin breakdown will heal within 14 weeks Date Initiated: 06/16/2022 Target Resolution Date: 10/15/2022 Goal Status: Active Interventions: Assess patient/caregiver ability to obtain necessary supplies Assess patient/caregiver ability to perform ulcer/skin care regimen upon admission and as needed Assess ulceration(s) every visit Notes: Electronic Signature(s) Signed: 07/14/2022 12:34:01 PM By: Levora Dredge Entered By: Levora Dredge on 07/14/2022 12:34:01 -------------------------------------------------------------------------------- Pain Assessment Details Patient Name: Date of Service: Cheryl Melendez, Cheryl Melendez 07/14/2022 10:30 A M Medical Record Number: [id number] Patient Account Number: [id number] Date of Birth/Sex: Treating RN: January 14, 1951 (72 y.o. [name] Primary Care Siona Coulston: Early Osmond Other Clinician: Referring Carlin Attridge: Treating Harshaan Whang/Extender: Norlene Duel, Rinka Weeks in Treatment: 4 Active Problems Location of Pain Severity and Description of Pain Patient Has Paino Yes Site Locations Rate the pain. Cheryl Melendez, Cheryl Melendez ([id number]) [id number] Page 6 of 8 Rate the pain. Current Pain Level: 2 Pain Management and Medication Current Pain Management: Electronic Signature(s) Signed: 07/14/2022 4:12:26 PM By: Levora Dredge Entered By: Levora Dredge on 07/14/2022 10:34:08 -------------------------------------------------------------------------------- Patient/Caregiver Education Details Patient Name: Date of Service: Cheryl Melendez 3/25/2024andnbsp10:30 [place] Record Number: [id number] Patient Account Number: [id number] Date of Birth/Gender: Treating RN: October 19, 1950 (72 y.o. [name] Primary Care Physician: Early Osmond Other Clinician: Referring Physician: Treating  Physician/Extender: Holley Dexter in Treatment: 4 Education Assessment Education Provided To: Patient Education Topics Provided Wound/Skin Impairment: Handouts: Caring for Your Ulcer Methods: Explain/Verbal Responses: State content correctly Electronic Signature(s) Signed: 07/14/2022 4:12:26 PM By: Levora Dredge Entered By: Levora Dredge on 07/14/2022 12:34:13 -------------------------------------------------------------------------------- Wound Assessment Details Patient Name: Date of Service: Cheryl Melendez, Cheryl Melendez 07/14/2022 10:30 A M Medical  Record Number: [id number] Patient Account Number: [id number] Date of Birth/Sex: Treating RN: November 17, 1950 (72 y.o. [name] Primary Care Belem Hintze: Early Osmond Other Clinician: Referring Isreal Moline: Treating Anh Mangano/Extender: Norlene Duel, Rinka Weeks in Treatment: 4 Wound Status Wound Number: 1 Primary Etiology: Lesion Wound Location: Left Elbow Wound Status: Open Wounding Event: Trauma Date Acquired: 04/21/2022 [facility] Of Treatment: [address] Melendez ([id number]) [id number] Page 7 of 8 Clustered Wound: No Photos Wound Measurements Length: (cm) 1.2 Width: (cm) 0.5 Depth: (cm) 0.3 Area: (cm) 0.471 Volume: (cm) 0.141 % Reduction in Area: -33.4% % Reduction in Volume: 33.5% Epithelialization: None Tunneling: No Undermining: Yes Starting Position (o'clock): 6 Ending Position (o'clock): 7 Maximum Distance: (cm) 1 Wound Description Classification: Full Thickness Without Exposed Support Structures Exudate Amount: Medium Exudate Type: Serosanguineous Exudate Color: red, brown Foul Odor After Cleansing: No Slough/Fibrino Yes Wound Bed Granulation Amount: Small (1-33%) Exposed Structure Granulation Quality: Red, Pink Fascia Exposed: No Necrotic Amount: Large (67-100%) Fat Layer (Subcutaneous Tissue) Exposed: Yes Necrotic Quality: Adherent Slough Tendon Exposed: No Muscle Exposed: No Joint Exposed: No Bone Exposed: No Treatment Notes Wound #1 (Elbow) Wound Laterality: Left Cleanser [place] Ancillary Kit - 15 Day Supply Discharge Instruction: Use supplies as instructed; Kit contains: (15) Saline Bullets; (15) 3x3 Gauze; 15 pr Gloves Soap and Water Discharge Instruction: Gently cleanse wound with antibacterial soap, rinse and pat dry prior to dressing wounds Wound Cleanser Discharge Instruction: Wash your hands with soap and water. Remove old dressing, discard into plastic bag and place into trash. Cleanse the wound with Wound  Cleanser prior to applying a clean dressing using gauze sponges, not tissues or cotton balls. Do not scrub or use excessive force. Pat dry using gauze sponges, not tissue or cotton balls. Peri-Wound Care Topical Primary Dressing Gauze Discharge Instruction: As directed: moistened with vashe solution Secondary Dressing ABD Pad 5x9 (in/in) Discharge Instruction: Cover with ABD pad Gauze Discharge Instruction: As directed: dry Secured With Stretch Net Dressing, Latex-free, Size 5, Small-Head / Shoulder / Thigh Compression OTTO, EADER Melendez ([id number]) [id number] Page 8 of 8 Compression Stockings Add-Ons Electronic Signature(s) Signed: 07/14/2022 10:47:02 AM By: Levora Dredge Entered By: Levora Dredge on 07/14/2022 10:47:02 -------------------------------------------------------------------------------- Vitals Details Patient Name: Date of Service: Cheryl Coss Melendez. 07/14/2022 10:30 A M Medical Record Number: [id number] Patient Account Number: [id number] Date of Birth/Sex: Treating RN: March 12, 1951 (72 y.o. [name] Primary Care Blinda Turek: Early Osmond Other Clinician: Referring Vertie Dibbern: Treating Mechel Schutter/Extender: Norlene Duel, Rinka Weeks in Treatment: 4 Vital Signs Time Taken: 10:33 Temperature (F): 98.1 Height (in): 66 Pulse (bpm): 105 Weight (lbs): 150 Respiratory Rate (breaths/min): 18 Body Mass Index (BMI): 24.2 Blood Pressure (mmHg): 171/69 Reference Range: 80 - 120 mg / dl Electronic Signature(s) Signed: 07/14/2022 4:12:26 PM By: Levora Dredge Entered By: Levora Dredge on 07/14/2022 10:34:02

## 2022-07-28 ENCOUNTER — Ambulatory Visit: Payer: Medicare PPO | Admitting: Physician Assistant

## 2022-08-04 ENCOUNTER — Ambulatory Visit: Payer: Medicare PPO | Admitting: Physician Assistant

## 2022-08-11 DIAGNOSIS — G118 Other hereditary ataxias: Secondary | ICD-10-CM | POA: Diagnosis not present

## 2022-08-19 ENCOUNTER — Ambulatory Visit: Payer: Medicare PPO | Admitting: Neurology

## 2022-10-08 ENCOUNTER — Encounter: Payer: Self-pay | Admitting: Podiatry

## 2022-10-08 ENCOUNTER — Ambulatory Visit: Payer: Medicare PPO | Admitting: Podiatry

## 2022-10-08 DIAGNOSIS — B070 Plantar wart: Secondary | ICD-10-CM

## 2022-10-08 MED ORDER — FLUOROURACIL 5 % EX CREA: TOPICAL_CREAM | Freq: Two times a day (BID) | CUTANEOUS | 2 refills | Status: AC

## 2022-10-08 NOTE — Progress Notes (Signed)
Subjective:   Patient ID: Cheryl Melendez, female   DOB: 72 y.o.   MRN: [id number]   HPI Patient presents with caregiver with chronic lesions x 3 plantar aspect right foot painful   ROS      Objective:  Physical Exam  Scaler status intact with patient's right foot showing lesion formation x 4 pinpoint bleeding noted     Assessment:  Verruca plantaris plantar right     Plan:  Debrided tissue apply chemical agent to create immune response with sterile dressing and placed on Efudex for home usage.  Reappoint for [place] to recheck as needed may require more aggressive treatment plan at 1 point in future

## 2022-12-08 ENCOUNTER — Emergency Department: Payer: Medicare PPO

## 2022-12-08 ENCOUNTER — Emergency Department
Admission: EM | Admit: 2022-12-08 | Discharge: 2022-12-08 | Disposition: A | Discharge status: Home / Self Care | Payer: Medicare PPO | Attending: Emergency Medicine | Admitting: Emergency Medicine

## 2022-12-08 ENCOUNTER — Other Ambulatory Visit: Payer: Self-pay

## 2022-12-08 DIAGNOSIS — S022XXA Fracture of nasal bones, initial encounter for closed fracture: Secondary | ICD-10-CM | POA: Diagnosis not present

## 2022-12-08 DIAGNOSIS — W01198A Fall on same level from slipping, tripping and stumbling with subsequent striking against other object, initial encounter: Secondary | ICD-10-CM | POA: Insufficient documentation

## 2022-12-08 DIAGNOSIS — W19XXXA Unspecified fall, initial encounter: Secondary | ICD-10-CM

## 2022-12-08 DIAGNOSIS — S0990XA Unspecified injury of head, initial encounter: Secondary | ICD-10-CM | POA: Diagnosis not present

## 2022-12-08 DIAGNOSIS — M898X8 Other specified disorders of bone, other site: Secondary | ICD-10-CM | POA: Diagnosis not present

## 2022-12-08 DIAGNOSIS — S0992XA Unspecified injury of nose, initial encounter: Secondary | ICD-10-CM | POA: Diagnosis present

## 2022-12-08 DIAGNOSIS — S0285XA Fracture of orbit, unspecified, initial encounter for closed fracture: Secondary | ICD-10-CM | POA: Insufficient documentation

## 2022-12-08 DIAGNOSIS — M79642 Pain in left hand: Secondary | ICD-10-CM | POA: Diagnosis not present

## 2022-12-08 DIAGNOSIS — S6982XA Other specified injuries of left wrist, hand and finger(s), initial encounter: Secondary | ICD-10-CM | POA: Diagnosis not present

## 2022-12-08 DIAGNOSIS — S0232XA Fracture of orbital floor, left side, initial encounter for closed fracture: Secondary | ICD-10-CM | POA: Diagnosis not present

## 2022-12-08 DIAGNOSIS — M25332 Other instability, left wrist: Secondary | ICD-10-CM

## 2022-12-08 DIAGNOSIS — S01112A Laceration without foreign body of left eyelid and periocular area, initial encounter: Secondary | ICD-10-CM | POA: Insufficient documentation

## 2022-12-08 DIAGNOSIS — S0181XA Laceration without foreign body of other part of head, initial encounter: Secondary | ICD-10-CM

## 2022-12-08 LAB — CBG MONITORING, ED: Glucose-Capillary: 146 mg/dL — ABNORMAL HIGH (ref 70–99)

## 2022-12-08 MED ORDER — ACETAMINOPHEN 500 MG PO TABS
1000.0000 mg | ORAL_TABLET | Freq: Once | ORAL | Status: AC
  Administered 2022-12-08: 1000 mg via ORAL
  Filled 2022-12-08: qty 2

## 2022-12-08 MED ORDER — LIDOCAINE HCL (PF) 1 % IJ SOLN
5.0000 mL | Freq: Once | INTRAMUSCULAR | Status: AC
  Administered 2022-12-08: 5 mL
  Filled 2022-12-08: qty 5

## 2022-12-08 NOTE — ED Provider Notes (Signed)
[facility] Provider Note    Event Date/Time   First MD Initiated Contact with Patient 12/08/22 1900     (approximate)   History   Fall   HPI  Cheryl Melendez is a 72 y.o. female past medical history significant for spinocerebellar ataxia, who presents to the emergency department following a fall.  Patient states that she has a neurologic issue that causes her to have frequent falls.  [name] today and hit her head and her left arm.  Not on anticoagulation.  Denies any change in vision.  No chest pain or shortness of breath.  Tetanus is up-to-date.     Physical Exam   Triage Vital Signs: ED Triage Vitals  Encounter Vitals Group     BP 12/08/22 1650 (!) 164/88     Systolic BP Percentile --      Diastolic BP Percentile --      Pulse Rate 12/08/22 1650 (!) 120     Resp 12/08/22 1650 18     Temp 12/08/22 1650 99.4 F (37.4 C)     Temp Source 12/08/22 1650 Oral     SpO2 12/08/22 1650 94 %     Weight --      Height --      Head Circumference --      Peak Flow --      Pain Score 12/08/22 1648 6     Pain Loc --      Pain Education --      Exclude from Growth Chart --     Most recent vital signs: Vitals:   12/08/22 1650 12/08/22 1853  BP: (!) 164/88 (!) 160/60  Pulse: (!) 120 (!) 105  Resp: 18 17  Temp: 99.4 F (37.4 C) 98.5 F (36.9 C)  SpO2: 94% 100%    Physical Exam HENT:     Head:     Comments: Ecchymosis to the nasal bridge.  No septal hematoma.    Right Ear: External ear normal.     Left Ear: External ear normal.  Eyes:     Extraocular Movements: Extraocular movements intact.     Conjunctiva/sclera: Conjunctivae normal.     Pupils: Pupils are equal, round, and reactive to light.     Comments: Large laceration above the left eyebrow.  Extraocular movements are intact and pupils are equal and reactive with no direct or consensual photophobia.  Cardiovascular:     Rate and Rhythm: Normal rate.  Pulmonary:     Effort: No respiratory  distress.  Abdominal:     General: There is no distension.     Tenderness: There is no abdominal tenderness.  Musculoskeletal:        General: No tenderness. Normal range of motion.     Cervical back: No rigidity or tenderness.     Comments: No midlines cervical, thoracic or lumbar tenderness to palpation.  Tenderness palpation to the left hand with ecchymosis.  Negative snuffbox tenderness.  Full flexion and extension of all fingers.  Good capillary refill.  No tenderness to the elbow or upper arm.  Old ecchymosis to the right hand.  No underlying tenderness to palpation.  No tenderness to the pelvis or bilateral lower extremities.  Skin:    General: Skin is warm.     Capillary Refill: Capillary refill takes less than 2 seconds.  Neurological:     General: No focal deficit present.     Mental Status: She is alert.      IMPRESSION /  MDM / ASSESSMENT AND PLAN / ED COURSE  I reviewed the triage vital signs and the nursing notes.  Differential diagnosis including intracranial hemorrhage, fracture, dislocation, cervical fracture  Glucose within normal limits  RADIOLOGY I independently reviewed imaging, my interpretation of imaging: X-ray of the left hand without obvious fracture.  X-ray is read as widening of the scapholunate interval with unknown acuity.  CT scan of the head and face with acute displaced fracture to the left nasal bridge.  Age-indeterminate left orbital floor fracture.  No signs of entrapment on exam   Labs (all labs ordered are listed, but only abnormal results are displayed) Labs interpreted as -    Labs Reviewed  CBG MONITORING, ED - Abnormal; Notable for the following components:      Result Value   Glucose-Capillary 146 (*)    All other components within normal limits    Wounds irrigated and repaired at bedside.  At mental status baseline.  Given information to follow-up with ophthalmology for orbital wall fracture and ENT to follow-up with nasal bridge  fracture.  Given symptomatic treatment instructions.  Discussed nose nose blowing.  No signs of a septal hematoma.  No signs of entrapment.  Discussed return for any worsening symptoms  Questionable scapholunate widening -mild tenderness to palpation.  Placed in a splint and stated that they would follow-up with her orthopedic doctor who already knows the patient well.  Discussed close follow-up with primary care physician.  Given return precautions for any worsening symptoms.     PROCEDURES:  Critical Care performed: No  ..Laceration Repair  Date/Time: 12/08/2022 9:01 PM  Performed by: Corena Herter, MD Authorized by: Corena Herter, MD   Consent:    Consent obtained:  Verbal   Consent given by:  Patient   Risks, benefits, and alternatives were discussed: yes     Risks discussed:  Pain, nerve damage, poor wound healing, poor cosmetic result, vascular damage, tendon damage, infection and need for additional repair   Alternatives discussed:  Delayed treatment and no treatment Universal protocol:    Procedure explained and questions answered to patient or proxy's satisfaction: yes     Relevant documents present and verified: yes     Test results available: yes     Imaging studies available: yes     Required blood products, implants, devices, and special equipment available: yes     Patient identity confirmed:  Verbally with patient Anesthesia:    Anesthesia method:  Local infiltration   Local anesthetic:  Lidocaine 1% w/o epi Laceration details:    Location:  Face   Face location:  L eyebrow   Length (cm):  6 Pre-procedure details:    Preparation:  Patient was prepped and draped in usual sterile fashion Treatment:    Amount of cleaning:  Standard   Irrigation solution:  Sterile saline Skin repair:    Repair method:  Sutures   Suture size:  6-0   Suture material:  Prolene   Suture technique:  Simple interrupted   Number of sutures:  11 Approximation:    Approximation:   Close Repair type:    Repair type:  Intermediate Post-procedure details:    Dressing:  Sterile dressing   Procedure completion:  Tolerated well, no immediate complications   Patient's presentation is most consistent with acute complicated illness / injury requiring diagnostic workup.   MEDICATIONS ORDERED IN ED: Medications  lidocaine (PF) (XYLOCAINE) 1 % injection 5 mL (has no administration in time range)  acetaminophen (TYLENOL) tablet  1,000 mg (1,000 mg Oral Given 12/08/22 2047)    FINAL CLINICAL IMPRESSION(S) / ED DIAGNOSES   Final diagnoses:  Fall, initial encounter  Injury of head, initial encounter  Laceration of forehead, initial encounter  Closed fracture of nasal bone, initial encounter  Closed fracture of orbital wall, initial encounter (HCC)  Scapholunate dissociation of left wrist     Rx / DC Orders   ED Discharge Orders     None        Note:  This document was prepared using Dragon voice recognition software and may include unintentional dictation errors.   Corena Herter, MD 12/08/22 2144

## 2022-12-08 NOTE — ED Triage Notes (Signed)
Pt comes with c/o mechanical fall. Pt states she fell striking her head and face. Pt denies any loc. Pt is not on thinners. Pt states head, forehead pain. Pt has abrasion and lac to face and forehead.

## 2022-12-08 NOTE — Discharge Instructions (Addendum)
You were seen in the emergency department following a fall.  You had a large laceration to your left forehead that was repaired.  Absorbable sutures were used and they should fall out on its own.  Keep triple antibiotic ointment like bacitracin or Neosporin on your laceration twice a day and keep it covered and out of the sun.  Follow-up in the next 1 week with your primary care physician for wound recheck/suture removal fall  You were given of information to follow-up with the ear nose and throat doctor for your nasal fracture.  Apply ice for approximately 15 minutes at a time a couple of times a day to help with the swelling.  No nose blowing.  You are given information to follow-up with the eye specialist for your orbital wall fracture.  You had a questionable scapholunate dislocation, you were placed in a wrist splint.  Follow-up with your orthopedic doctor.  Pain control:  Ibuprofen (motrin/aleve/advil) - You can take 3 tablets (600 mg) every 6 hours as needed for pain/fever.  Acetaminophen (tylenol) - You can take 2 extra strength tablets (1000 mg) every 6 hours as needed for pain/fever.  You can alternate these medications or take them together.  Make sure you eat food/drink water when taking these medications.

## 2022-12-16 DIAGNOSIS — Z4802 Encounter for removal of sutures: Secondary | ICD-10-CM | POA: Diagnosis not present

## 2022-12-16 DIAGNOSIS — W19XXXA Unspecified fall, initial encounter: Secondary | ICD-10-CM | POA: Diagnosis not present

## 2022-12-16 DIAGNOSIS — S0181XA Laceration without foreign body of other part of head, initial encounter: Secondary | ICD-10-CM | POA: Diagnosis not present

## 2022-12-25 DIAGNOSIS — L719 Rosacea, unspecified: Secondary | ICD-10-CM | POA: Diagnosis not present

## 2022-12-25 DIAGNOSIS — H10413 Chronic giant papillary conjunctivitis, bilateral: Secondary | ICD-10-CM | POA: Diagnosis not present

## 2022-12-25 DIAGNOSIS — S0512XA Contusion of eyeball and orbital tissues, left eye, initial encounter: Secondary | ICD-10-CM | POA: Diagnosis not present

## 2022-12-25 DIAGNOSIS — H0102B Squamous blepharitis left eye, upper and lower eyelids: Secondary | ICD-10-CM | POA: Diagnosis not present

## 2022-12-25 DIAGNOSIS — H2513 Age-related nuclear cataract, bilateral: Secondary | ICD-10-CM | POA: Diagnosis not present

## 2022-12-25 DIAGNOSIS — H04123 Dry eye syndrome of bilateral lacrimal glands: Secondary | ICD-10-CM | POA: Diagnosis not present

## 2022-12-25 DIAGNOSIS — H0102A Squamous blepharitis right eye, upper and lower eyelids: Secondary | ICD-10-CM | POA: Diagnosis not present

## 2023-02-10 IMAGING — CT CT HEAD W/O CM
3 series · 15 of 47 positions shown, 18 images · non-contrast
Comparison: 02/11/2018

CLINICAL DATA: Mental status changes of unknown cause. Difficulty
sleeping. Falling. Personality changes.

EXAM:
CT HEAD WITHOUT CONTRAST
TECHNIQUE: Contiguous axial images were obtained from the base of the skull
through the vertex without intravenous contrast.

[Series 3: head 5.0 h30s · axial · 0.44mm/px · z∈[-83,+62]mm · 9 of 35 slices shown, 12 images]
[im 3/35  brain]
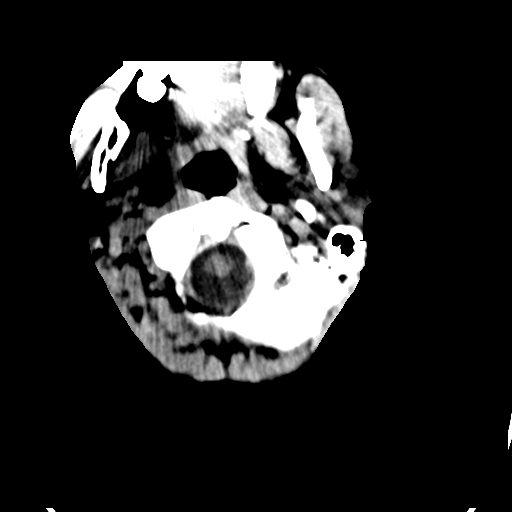
[im 3/35  bone]
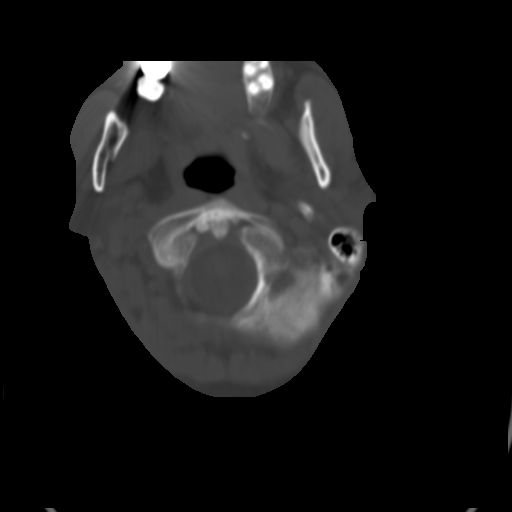
[im 6/35  brain]
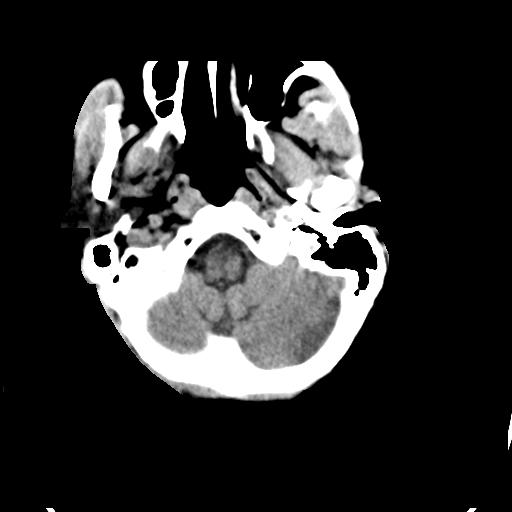
[im 10/35  brain]
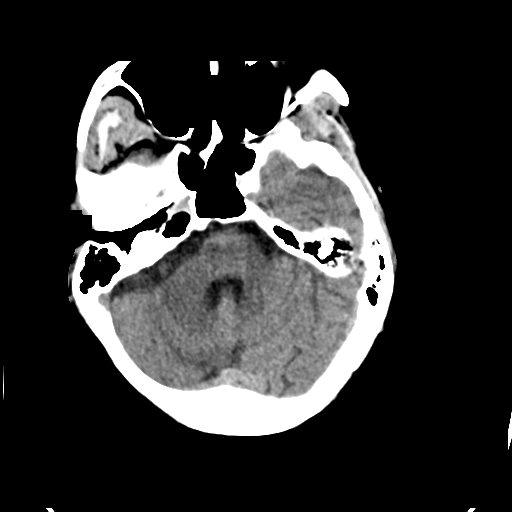
[im 13/35  brain]
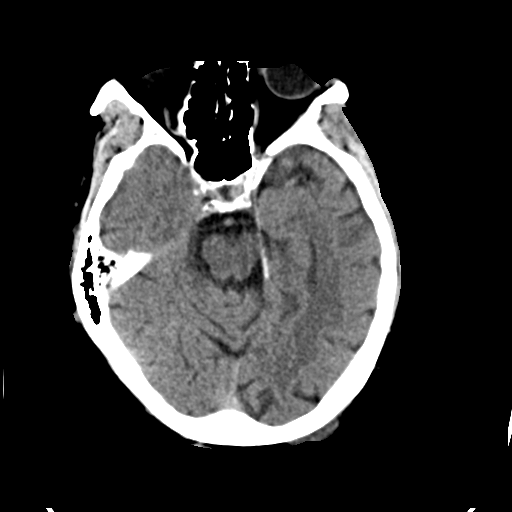
[im 18/35  brain]
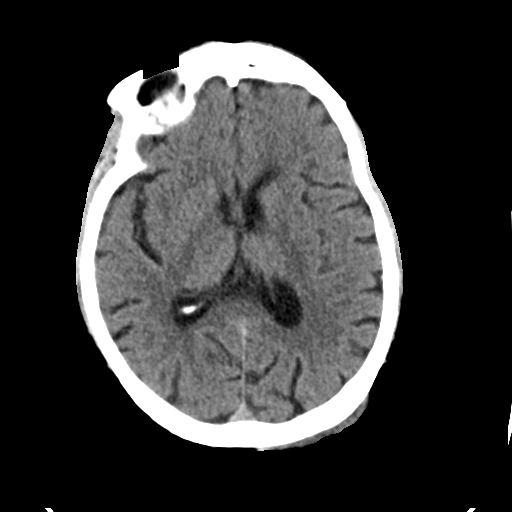
[im 18/35  bone]
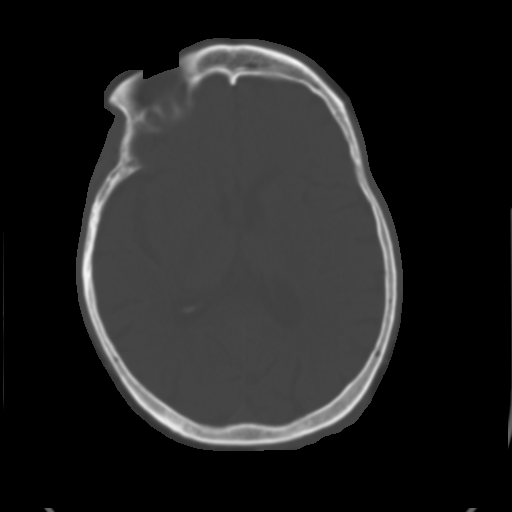
[im 22/35  brain]
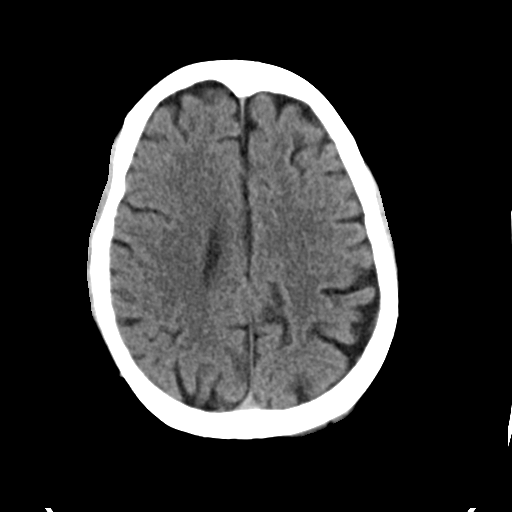
[im 25/35  brain]
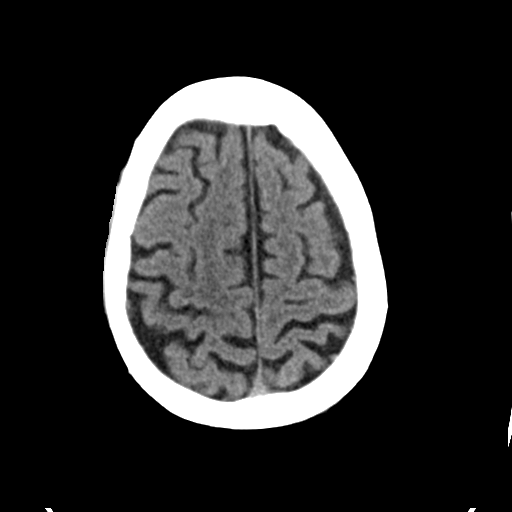
[im 29/35  brain]
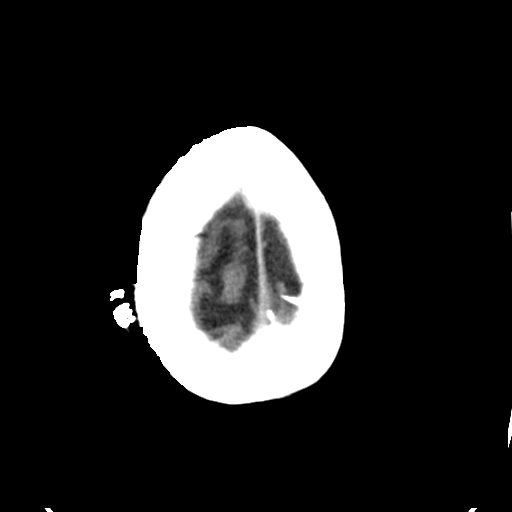
[im 32/35  brain]
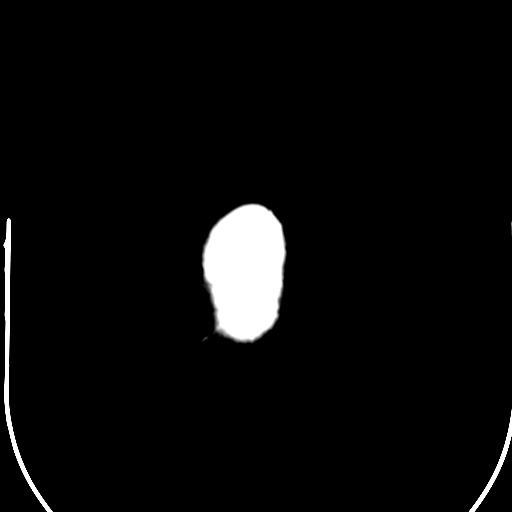
[im 32/35  bone]
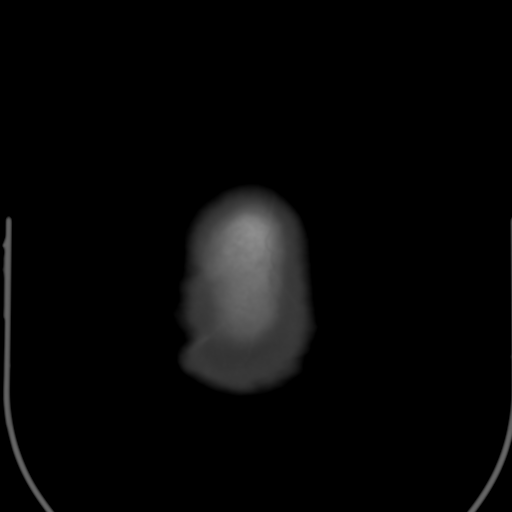

[Series 5: head 3.0 mpr cor · coronal · 0.30mm/px · 3 of 67 slices shown]
[im 23/67  brain]
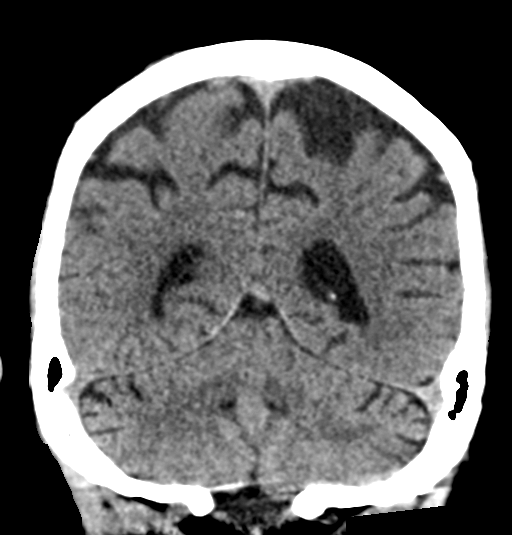
[im 30/67  brain]
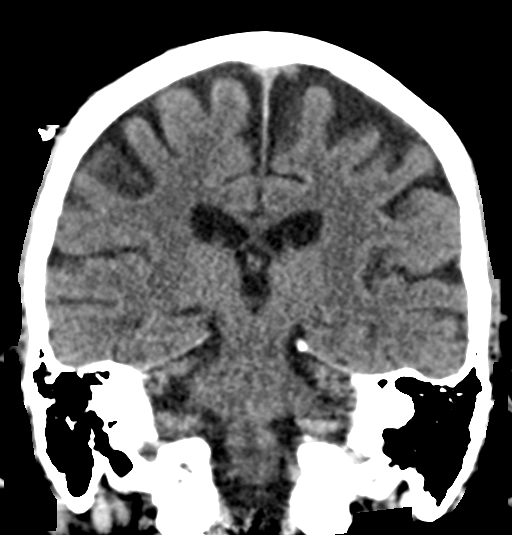
[im 37/67  brain]
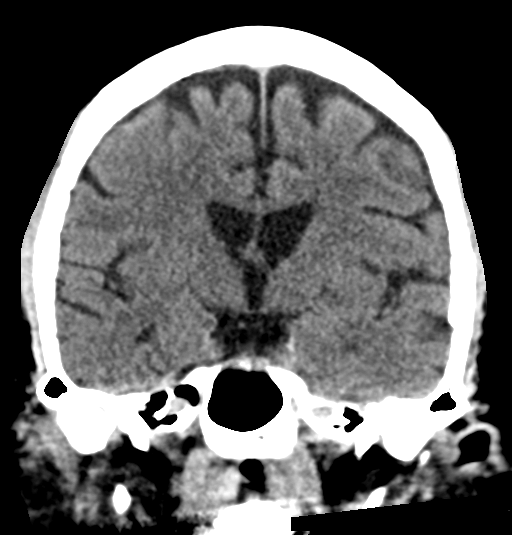

[Series 6: head 3.0 mpr sag · sagittal · 0.32mm/px · 3 of 55 slices shown]
[im 19/55  brain]
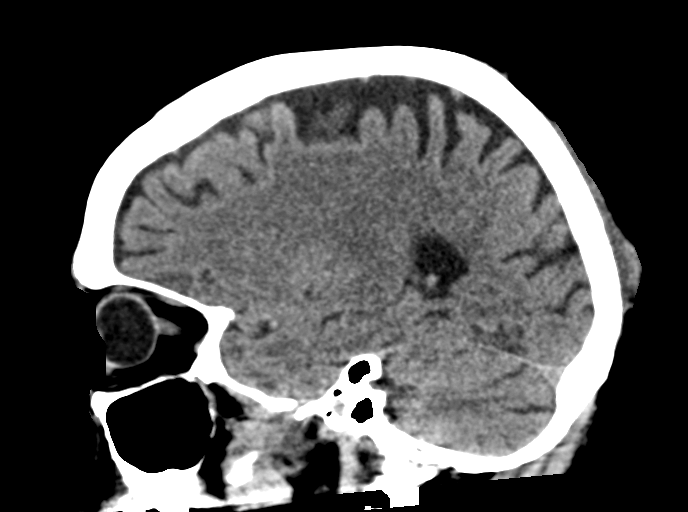
[im 28/55  brain]
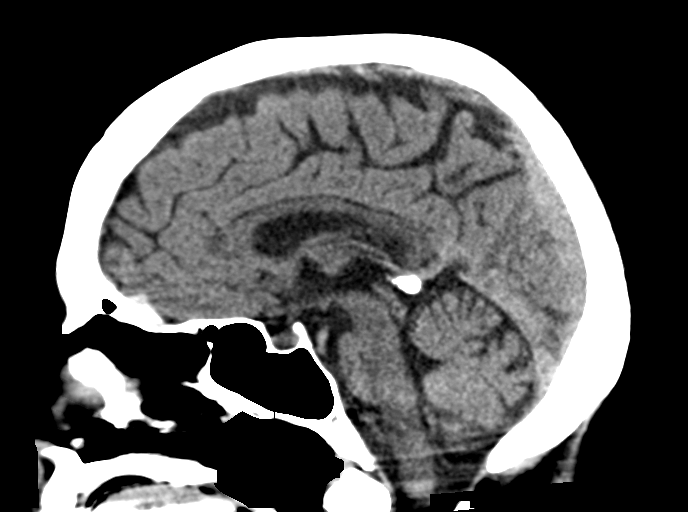
[im 37/55  brain]
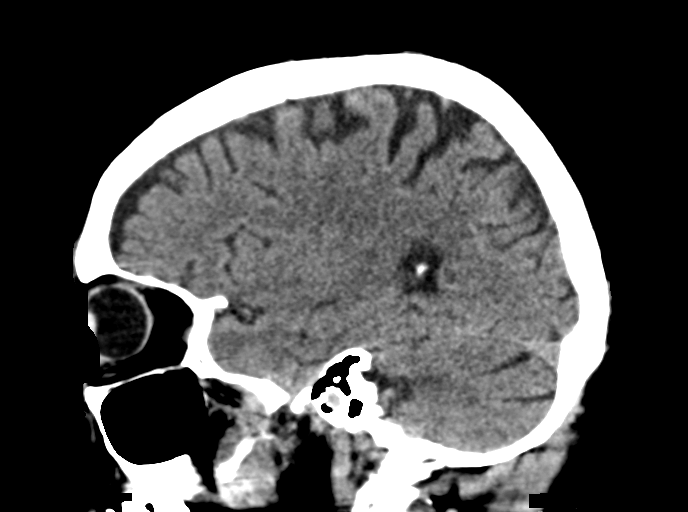

[15 of 47 positions shown; findings below may reference images not displayed]

FINDINGS: Brain: Mild age related volume loss. No evidence of focal
infarction, mass lesion, hemorrhage, hydrocephalus or extra-axial
collection.

Vascular: There is atherosclerotic calcification of the major
vessels at the base of the brain.

Skull: No skull fracture.

Sinuses/Orbits: Clear/normal

Other: Skin staples right parietal vertex region.
IMPRESSION: No acute intracranial finding. No skull fracture. Mild age related
atrophy. Mild chronic small-vessel change of the white matter.

Right parietal scalp staples.

## 2023-04-11 IMAGING — CT CT CERVICAL SPINE W/O CM
5 of 8 series · 13 of 33 positions shown, 14 images · non-contrast
Comparison: Head CT 02/06/2021.

CLINICAL DATA: 70-year-old female with history of dizziness.
Multiple falls with injury to the head.

EXAM:
CT HEAD WITHOUT CONTRAST
CT CERVICAL SPINE WITHOUT CONTRAST
TECHNIQUE: Multidetector CT imaging of the head and cervical spine was
performed following the standard protocol without intravenous
contrast. Multiplanar CT image reconstructions of the cervical spine
were also generated.

[Series 4: head bone · axial · 0.39mm/px · z∈[-106,-56]mm · 2 of 77 slices shown]
[im 26/77  bone]
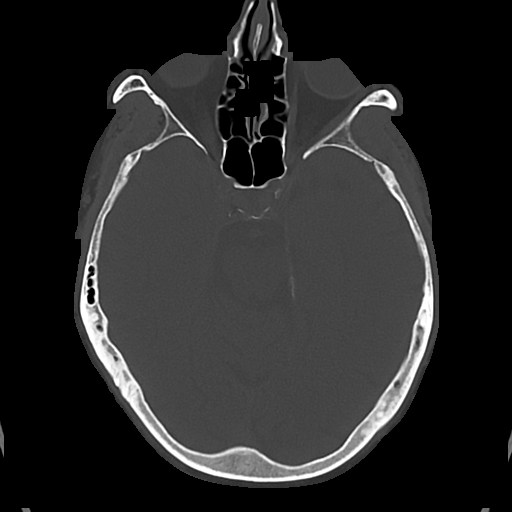
[im 51/77  bone]
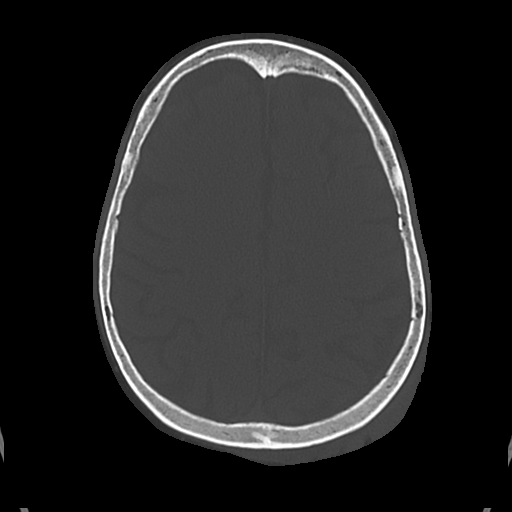

[Series 8: c spine soft · axial · 0.37mm/px · z∈[-248,-188]mm · 2 of 91 slices shown]
[im 31/91  soft-tissue]
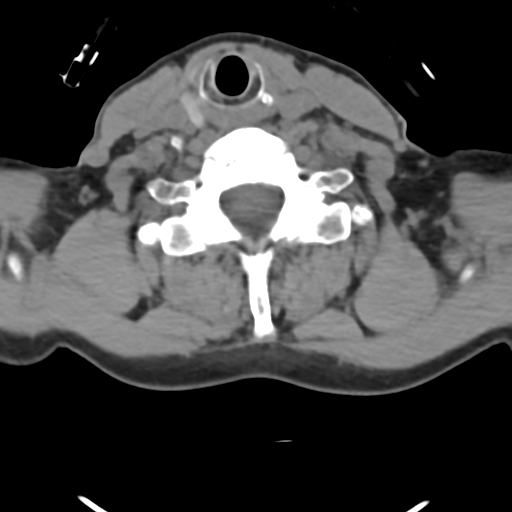
[im 61/91  soft-tissue]
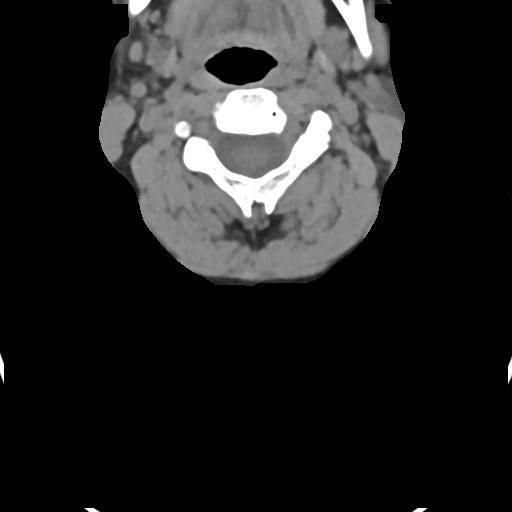

[Series 11: sag bone · sagittal · 0.31mm/px · 5 of 63 slices shown]
[im 11/63  bone]
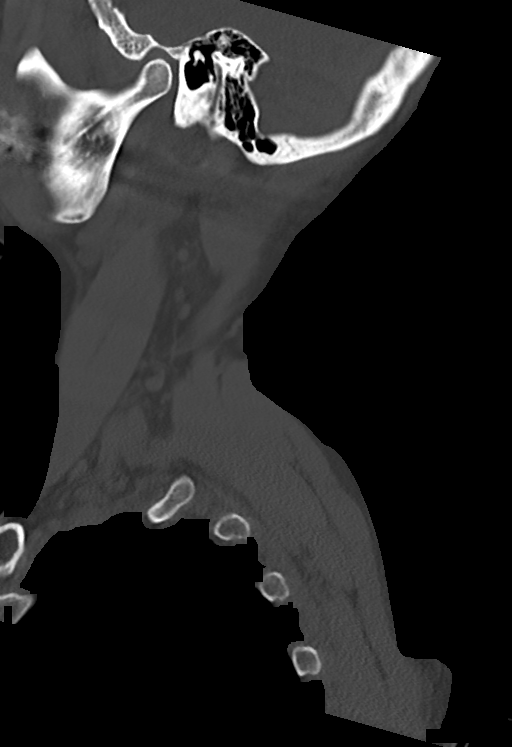
[im 21/63  bone]
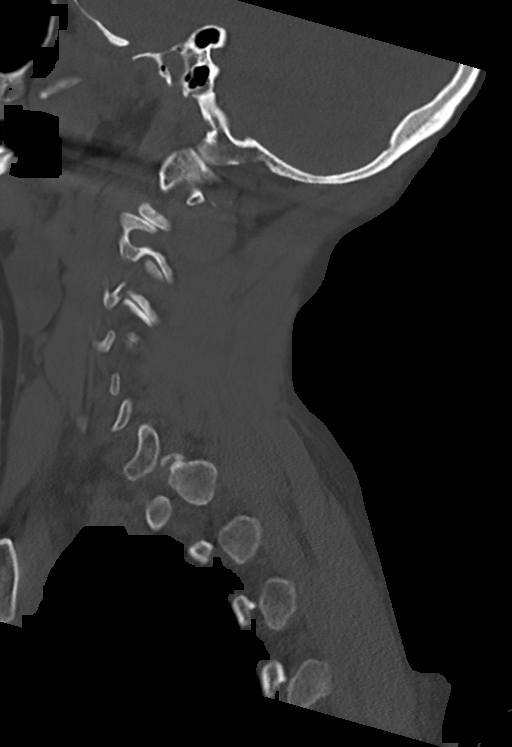
[im 32/63  bone]
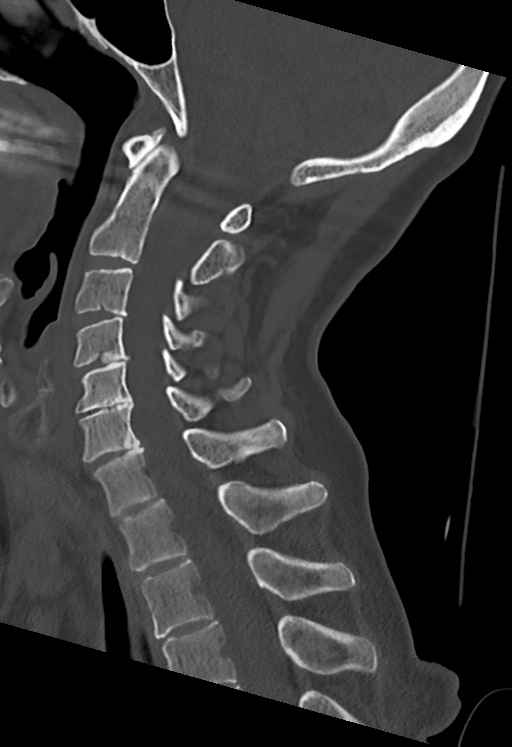
[im 42/63  bone]
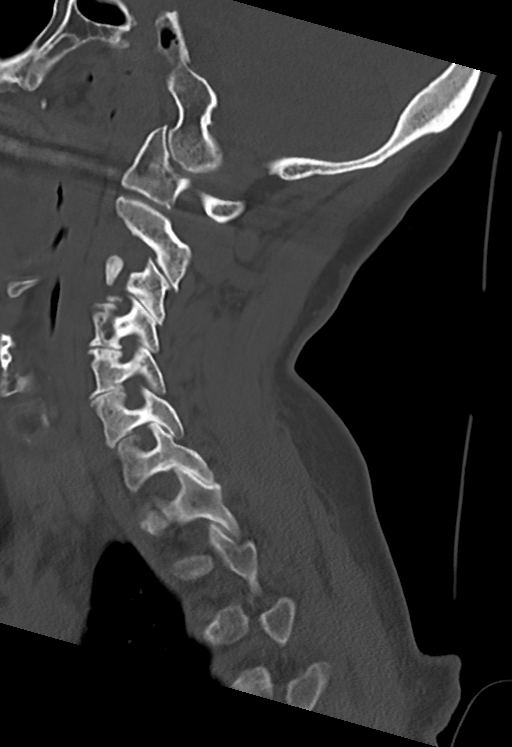
[im 52/63  bone]
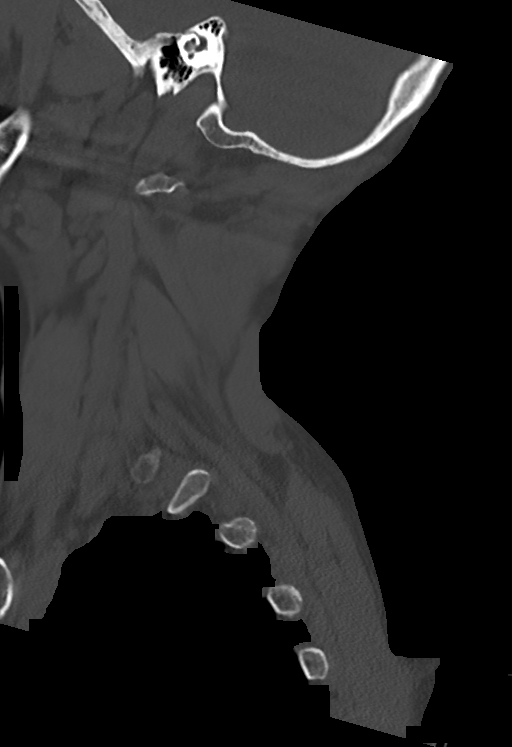

[Series 12: cor bone · coronal · 0.28mm/px · 1 of 61 slices shown]
[im 31/61  bone]
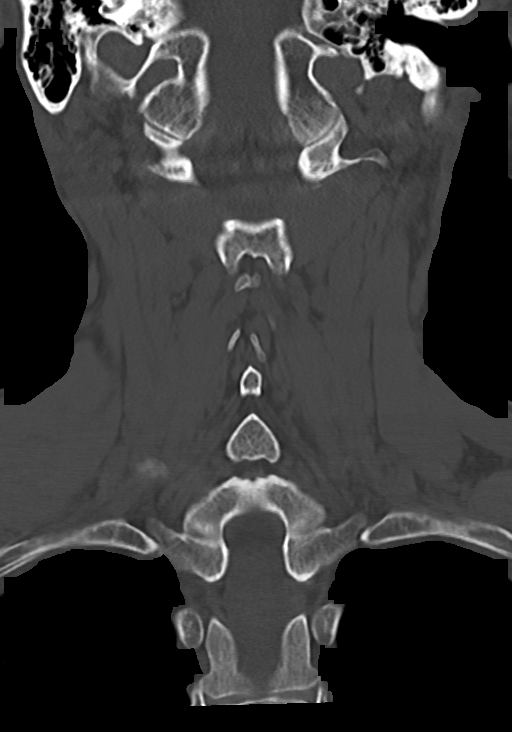

[Series 13: orthogonal axials · axial · 0.21mm/px · z∈[-288,-194]mm · 3 of 97 slices shown, 4 images]
[im 25/97  soft-tissue]
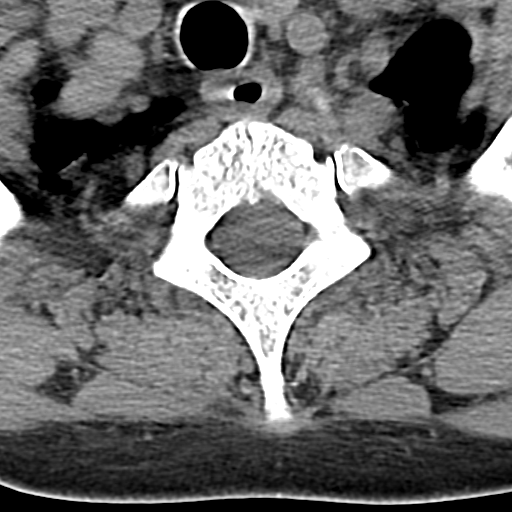
[im 25/97  bone]
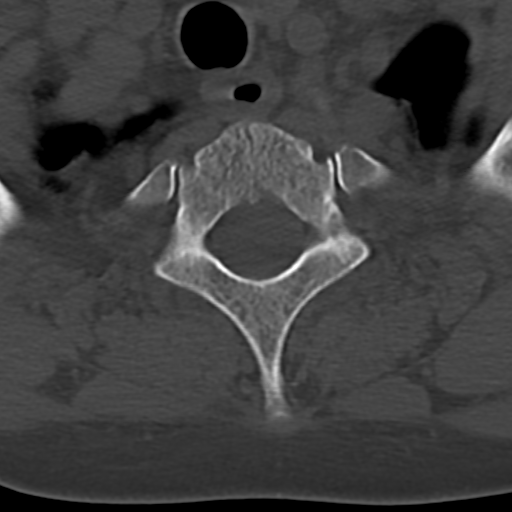
[im 49/97  bone]
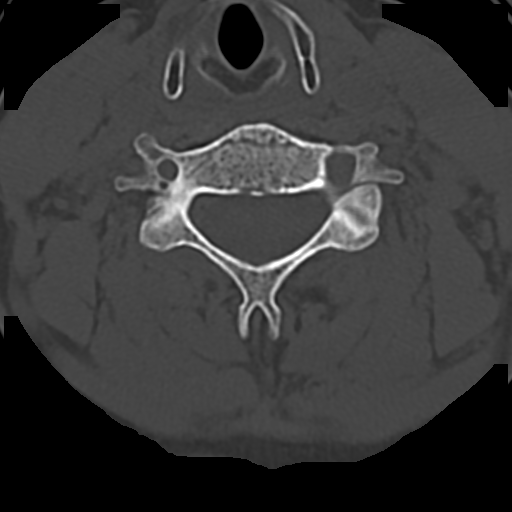
[im 73/97  bone]
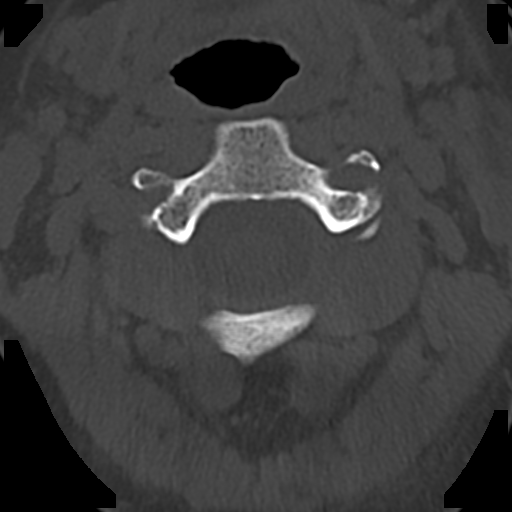

[13 of 33 positions shown; findings below may reference images not displayed]

FINDINGS: CT HEAD FINDINGS

Brain: No evidence of acute infarction, hemorrhage, hydrocephalus,
extra-axial collection or mass lesion/mass effect.

Vascular: No hyperdense vessel or unexpected calcification.

Skull: Normal. Negative for fracture or focal lesion.

Sinuses/Orbits: No acute finding.

Other: Large amount of high attenuation soft tissue swelling in the
left parieto-occipital scalp compatible with a large scalp
contusion/hematoma, where there is also focal irregularity
suggesting a laceration.

CT CERVICAL SPINE FINDINGS

Alignment: Normal.

Skull base and vertebrae: No acute fracture. No primary bone lesion
or focal pathologic process.

Soft tissues and spinal canal: No prevertebral fluid or swelling. No
visible canal hematoma.

Disc levels: Multilevel degenerative disc disease, most pronounced
at C5-C6. Mild multilevel facet arthropathy.

Upper chest: Unremarkable.

Other: None.
IMPRESSION: 1. Large left parieto-occipital scalp contusion/hematoma with
probable laceration. No underlying displaced skull fracture or signs
of significant acute traumatic injury to the brain.
2. No evidence of significant acute traumatic injury to the cervical
spine.
3. The appearance of the brain is normal.
4. Multilevel degenerative disc disease and cervical spondylosis, as
above.
